# Patient Record
Sex: Female | Born: 1975
Health system: Southern US, Community
[De-identification: ages and names within clinical notes are randomized; demographics above are authoritative.]

## PROBLEM LIST (undated history)

## (undated) DIAGNOSIS — F32A Depression, unspecified: Secondary | ICD-10-CM

## (undated) DIAGNOSIS — I10 Essential (primary) hypertension: Secondary | ICD-10-CM

## (undated) DIAGNOSIS — I341 Nonrheumatic mitral (valve) prolapse: Secondary | ICD-10-CM

## (undated) DIAGNOSIS — F329 Major depressive disorder, single episode, unspecified: Secondary | ICD-10-CM

## (undated) DIAGNOSIS — D126 Benign neoplasm of colon, unspecified: Secondary | ICD-10-CM

## (undated) DIAGNOSIS — Z8619 Personal history of other infectious and parasitic diseases: Secondary | ICD-10-CM

## (undated) DIAGNOSIS — B9689 Other specified bacterial agents as the cause of diseases classified elsewhere: Secondary | ICD-10-CM

## (undated) DIAGNOSIS — N83209 Unspecified ovarian cyst, unspecified side: Secondary | ICD-10-CM

## (undated) DIAGNOSIS — F419 Anxiety disorder, unspecified: Secondary | ICD-10-CM

## (undated) DIAGNOSIS — E559 Vitamin D deficiency, unspecified: Secondary | ICD-10-CM

## (undated) DIAGNOSIS — E039 Hypothyroidism, unspecified: Secondary | ICD-10-CM

## (undated) DIAGNOSIS — N760 Acute vaginitis: Secondary | ICD-10-CM

## (undated) HISTORY — DX: Unspecified ovarian cyst, unspecified side: N83.209

## (undated) HISTORY — PX: CYSTOSCOPY: SUR368

## (undated) HISTORY — DX: Anxiety disorder, unspecified: F41.9

## (undated) HISTORY — DX: Essential (primary) hypertension: I10

## (undated) HISTORY — DX: Major depressive disorder, single episode, unspecified: F32.9

## (undated) HISTORY — PX: OTHER SURGICAL HISTORY: SHX169

## (undated) HISTORY — DX: Depression, unspecified: F32.A

## (undated) HISTORY — DX: Vitamin D deficiency, unspecified: E55.9

## (undated) HISTORY — DX: Nonrheumatic mitral (valve) prolapse: I34.1

## (undated) HISTORY — DX: Hypothyroidism, unspecified: E03.9

## (undated) HISTORY — DX: Personal history of other infectious and parasitic diseases: Z86.19

## (undated) HISTORY — DX: Benign neoplasm of colon, unspecified: D12.6

## (undated) HISTORY — DX: Other specified bacterial agents as the cause of diseases classified elsewhere: N76.0

## (undated) HISTORY — DX: Acute vaginitis: B96.89

---

## 1998-06-03 DIAGNOSIS — I341 Nonrheumatic mitral (valve) prolapse: Secondary | ICD-10-CM

## 1998-06-03 HISTORY — DX: Nonrheumatic mitral (valve) prolapse: I34.1

## 2006-12-24 ENCOUNTER — Ambulatory Visit: Payer: Self-pay | Admitting: Internal Medicine

## 2007-03-09 ENCOUNTER — Ambulatory Visit: Payer: Self-pay | Admitting: Internal Medicine

## 2008-01-18 ENCOUNTER — Encounter: Payer: Self-pay | Admitting: Maternal and Fetal Medicine

## 2008-08-01 ENCOUNTER — Inpatient Hospital Stay: Payer: Self-pay

## 2015-02-03 LAB — HM PAP SMEAR: HM Pap smear: NORMAL

## 2015-02-16 ENCOUNTER — Ambulatory Visit: Payer: Federal, State, Local not specified - PPO | Attending: Obstetrics and Gynecology | Admitting: Physical Therapy

## 2015-02-16 ENCOUNTER — Encounter: Payer: Self-pay | Admitting: Physical Therapy

## 2015-02-16 DIAGNOSIS — Z7409 Other reduced mobility: Secondary | ICD-10-CM | POA: Insufficient documentation

## 2015-02-16 DIAGNOSIS — R279 Unspecified lack of coordination: Secondary | ICD-10-CM

## 2015-02-16 DIAGNOSIS — R531 Weakness: Secondary | ICD-10-CM | POA: Diagnosis present

## 2015-02-16 NOTE — Patient Instructions (Addendum)
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 Lay on side with a 3/4 roll to simulate partial laying on belly.     Open book 15 x./ 2 day At work open gate or arm swings  Low cobra (imagine squeezing pencil under armpits, elbows towards ribs)  -->   Wall elbow press  [place] post                                                                                                      -->    Arms behind back   (avoid squeezing gluts and use ehalaxation to engage deep core muscles. And isolate bend at mid back and not in low back)                                                   Preserve the function of your pelvic floor, abdomen, and back.              Avoid decreased straining of abdominal/pelvic floor muscles with less              slouching,  holding your breath with lifting/bowel movements)                                                     FUNCTIONAL POSTURES

## 2015-02-17 NOTE — Therapy (Deleted)
[place] MAIN [facility] SERVICES [address] [place], [place], [address] Phone: [phone]   Fax:  [phone]  Patient Details  Name: Keimya Briddell MRN: [id number] Date of Birth: [birth year] Referring Provider:  Chad Cordial, PA-C  Encounter Date: 02/16/2015   Jerl Mina ,PT, DPT, E-RYT  02/17/2015, 7:26 PM  [place] MAIN [facility] SERVICES [address] [place], [place], [address] Phone: [phone]   Fax:  [phone]

## 2015-02-21 NOTE — Therapy (Signed)
[place] MAIN [facility] SERVICES [address] [place], [place], [address] Phone: [phone]   Fax:  [phone]  Physical Therapy Evaluation  Patient Details  Name: Kaitlin Strickland MRN: [id number] Date of Birth: 11-Feb-1976 Referring Provider:  Chad Cordial, PA-C  Encounter Date: 02/16/2015      PT End of Session - 02/21/15 0820    Visit Number 1   Number of Visits 12   Date for PT Re-Evaluation 05/12/15   PT Start Time 1500   PT Stop Time 1610   PT Time Calculation (min) 70 min   Activity Tolerance Patient tolerated treatment well;No increased pain   Behavior During Therapy [facility] for tasks assessed/performed      Past Medical History  Diagnosis Date  . Anxiety     Past Surgical History  Procedure Laterality Date  . Wisdom teeth removal      There were no vitals filed for this visit.  Visit Diagnosis:  Lack of coordination - Plan: PT plan of care cert/re-cert  Weakness - Plan: PT plan of care cert/re-cert  Mobility impaired - Plan: PT plan of care cert/re-cert      Subjective Assessment - 02/21/15 0834    Subjective 1) shoulder/upper back pain started 3 years ago gradually without injury. Pt described it as achy and tense through her neck which worsens as the day progresses with standing and sitting on the job. 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 Pain at its worse 7/8-10 and at its best 0/10 which occurs mostly on the weekends.  Pt states she has limitations in yoga poses because her shoulders feel sore. Pt has started more strengthening w/ boot camp classes 3x/week . (10 lb UE bilaterally, 40 push-up intermittently ), yoga 1x/week "Intro to Yoga" with 10 x chatarungas. Pt sleeps on her belly,     2) LBP: Hx of LBP since high school, and increased LBP and hip pain during pregnancy 6 years ago. Pt started running 2 years post-partum and pt states it has helped with her hip and back pain.             [facility] PT Assessment - 02/21/15 0806    Observation/Other Assessments   Observations seated w/ ankles crossed under chair, slumped  forward head   Other Surveys  --  Pain Disability Index: 34%    Coordination   Gross Motor Movements are Fluid and Coordinated --  abdominal/pelvic floor straining w/ cue for bowel movement   Fine Motor Movements are Fluid and Coordinated --  limited pelvic floor lift noted through clothing    Posture/Postural Control   Posture Comments limited diaphragmatic excursion   ROM / Strength   AROM / PROM / Strength --  limited segmental thoracic mobility in ext/flexion   Strength   Overall Strength --  hip ext 4/5 bil w/ compensation (pelvic rocking, spinal ext)   Overall Strength Comments --  hipe abd bil 4/5    Palpation   Spinal mobility significant mm thoracic mm tensions/ hypomobile segments T-spine (upper segmentsincreased hypomobility)  SP noted    SI assessment  symmetrical   Bed Mobility   Bed Mobility --  half crunch method, able to log roll                   [facility] Adult PT Treatment/Exercise - 02/21/15 0806    Self-Care   Self-Care --  work stretches to offset mm tensions  Neuro Re-ed    Neuro Re-ed Details  proper sitting posture, positions to decrease pelvic floor/abdominal straining   Manual Therapy   Joint Mobilization PA mobs Grade II-III along SP /TP of  T-segments, MWM with low cobra (to address upper T-segments)    Soft tissue mobilization kneading along paraspinals                 PT Education - 02/21/15 0819    Education provided Yes   Education Details HEP, POC, anatomy & physiology, goals,    Person(s) Educated Patient   Methods Explanation;Demonstration;Tactile cues;Verbal cues;Handout   Comprehension Verbalized understanding;Returned demonstration             PT Long Term Goals - 02/21/15 3903    PT LONG TERM GOAL #1   Title Pt will decrease her score on [place] from 34% to < 24% in order to demo  improved pain and increased function at work, home, and recreational activities.    Time 12   Period Weeks   Status New   PT LONG TERM GOAL #2   Title Pt will demo improved thoracic mobility segmental with PAVM PA mobs and decreased mm tensions in order to progress to core strengthening.   Time 12   Period Weeks   Status New   PT LONG TERM GOAL #3   Title Pt will be compliant with HEP in order to demo IND with pain management.   Time 12   Period Weeks   Status New   PT LONG TERM GOAL #4   Title Pt will demo no spinal/pelvic instability with hip ext in prone in order to demo improved core strength and minimize risk with fitness activties.    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  These deficits interfere with her ability to participate in yoga classes, standing long periods, sleeping, and put her at risk for injuries in fitness activities.    Pt will benefit from skilled therapeutic intervention in order to improve on the following deficits Decreased activity tolerance;Decreased balance;Decreased safety awareness;Decreased range of motion;Decreased endurance;Hypomobility;Decreased strength;Increased muscle spasms;Decreased coordination;Decreased mobility;Increased fascial restricitons;Improper body mechanics;Pain;Postural dysfunction;Impaired flexibility   Rehab Potential Good   PT Frequency 1x / week   PT Duration 12 weeks   PT Treatment/Interventions ADLs/Self Care Home Management;Aquatic Therapy;Biofeedback;Cryotherapy;Electrical  Stimulation;Gait training;Fluidtherapy;Moist Heat;Traction;Stair training;Functional mobility training;Therapeutic activities;Therapeutic exercise;Balance training;Neuromuscular re-education;Manual techniques;Patient/family education;Passive range of motion;Dry needling;Energy conservation;Taping   PT Next Visit Plan pelvic floor assessment, manual releases at thorax   Consulted and Agree with Plan of Care Patient         Problem List There are no active problems to display for this patient.   Jerl Mina ,PT, DPT, E-RYT  02/21/2015, 8:50 AM  [place] MAIN [facility] SERVICES [address] [place], [place], [address] Phone: [phone]   Fax:  [phone]

## 2015-02-21 NOTE — Addendum Note (Signed)
Addended by: Jerl Mina on: 02/21/2015 08:53 AM   Modules accepted: Orders

## 2015-02-23 ENCOUNTER — Ambulatory Visit: Payer: Federal, State, Local not specified - PPO | Admitting: Physical Therapy

## 2015-02-23 DIAGNOSIS — R279 Unspecified lack of coordination: Secondary | ICD-10-CM

## 2015-02-23 DIAGNOSIS — Z7409 Other reduced mobility: Secondary | ICD-10-CM

## 2015-02-23 DIAGNOSIS — R531 Weakness: Secondary | ICD-10-CM

## 2015-02-23 NOTE — Patient Instructions (Addendum)
5  breaths of these back stretches in addition to open book   3-way childs pose in kneeling and at standing by chair (to do at work)   Supine twist (move hips over to opposite side of knees)       __________________   Dynamic Stabilization Level 1-2   You are now ready to begin training the deep core muscles system: diaphragm, transverse abdominis, pelvic floor . These muscles must work together as a team.           The key to these exercises to train the brain to coordinate the timing of these muscles and to have them turn on for long periods of time to hold you upright against gravity (especially important if you are on your feet all day).These muscles are postural muscles and play a role stabilizing your spine and bodyweight. By doing these repetitions slowly and correctly instead of doing crunches, you will achieve a flatter belly without a lower pooch. You are also placing your spine in a more neutral position and breathing properly which in turn, decreases your risk for problems related to your pelvic floor, abdominal, and low back such as pelvic organ prolapse, hernias, diastasis recti (separation of superficial muscles), disk herniations, spinal fractures. These exercises set a solid foundation for you to later progress to resistance/ strength training with therabands and weights and return to other typical fitness exercises with a stronger deeper core.    --------------   Pelvic Floor Endurance 10 sec holds, 3 sec/ 3-5 x day

## 2015-02-24 NOTE — Therapy (Signed)
[place] MAIN [facility] SERVICES [address] [place], [place], [address] Phone: [phone]   Fax:  [phone]  Physical Therapy Treatment  Patient Details  Name: Kaitlin Strickland MRN: [id number] Date of Birth: September 23, 1975 Referring Provider:  Chad Cordial, PA-C  Encounter Date: 02/23/2015      PT End of Session - 02/24/15 2338    Visit Number 2   Number of Visits 12   Date for PT Re-Evaluation 05/12/15   PT Start Time 1500   PT Stop Time 1605   PT Time Calculation (min) 65 min   Activity Tolerance Patient tolerated treatment well;No increased pain   Behavior During Therapy [facility] for tasks assessed/performed      Past Medical History  Diagnosis Date  . Anxiety     Past Surgical History  Procedure Laterality Date  . Wisdom teeth removal      There were no vitals filed for this visit.  Visit Diagnosis:  Lack of coordination  Weakness  Mobility impaired      Subjective Assessment - 02/24/15 2351    Subjective Pt reported feeling sore after last sesion but the soreness went away in the mid back. Pt has been recorrecting her posture at work and also with sleeping.                           [place] Adult PT Treatment/Exercise - 02/24/15 2329    Neuro Re-ed    Neuro Re-ed Details  dynamic stabilization 1-2, dissociation of pelvic from thorax w/ rotation, lower trunk rotatin, pelvic floor endurance strengthening   Exercises   Exercises --  3 way childs pose    Manual Therapy   Joint Mobilization PA mobs Grade II-III along SP /TP of  T-segments, MWM with low cobra (to address upper T-segments)    Soft tissue mobilization kneading along paraspinals                 PT Education - 02/23/15 1558    Education provided Yes   Education Details HEP, anantomy and physiology, principles of exercises, reason for new HEP   Person(s) Educated Patient   Methods Explanation;Demonstration;Tactile cues;Handout;Verbal  cues   Comprehension Verbalized understanding;Returned demonstration             PT Long Term Goals - 02/21/15 6063    PT LONG TERM GOAL #1   Title Pt will decrease her score on [place] from 34% to < 24% in order to demo improved pain and increased function at work, home, and recreational activities.    Time 12   Period Weeks   Status New   PT LONG TERM GOAL #2   Title Pt will demo improved thoracic mobility segmental with PAVM PA mobs and decreased mm tensions in order to progress to core strengthening.   Time 12   Period Weeks   Status New   PT LONG TERM GOAL #3   Title Pt will be compliant with HEP in order to demo IND with pain management.   Time 12   Period Weeks   Status New   PT LONG TERM GOAL #4   Title Pt will demo no spinal/pelvic instability with hip ext in prone in order to demo improved core strength and minimize risk with fitness activties.    Time 12   Period Weeks   Status New               Plan -  02/24/15 2339    Clinical Impression Statement Pt tolerated manual Tx without complaints. She demo'd decreased mm tensions around posterior thorax region and improved pelvic floor coordination after training. Pt continues to benefit from skilled PT to release mm tensions in thorax, increase thoracic  mobility,  and  improving  deep core  coordination /strength.     Pt will benefit from skilled therapeutic intervention in order to improve on the following deficits Decreased activity tolerance;Decreased balance;Decreased safety awareness;Decreased range of motion;Decreased endurance;Hypomobility;Decreased strength;Increased muscle spasms;Decreased coordination;Decreased mobility;Increased fascial restricitons;Improper body mechanics;Pain;Postural dysfunction;Impaired flexibility   Rehab Potential Good   PT Frequency 1x / week   PT Duration 12 weeks   PT Treatment/Interventions ADLs/Self Care Home Management;Aquatic Therapy;Biofeedback;Cryotherapy;Electrical  Stimulation;Gait training;Fluidtherapy;Moist Heat;Traction;Stair training;Functional mobility training;Therapeutic activities;Therapeutic exercise;Balance training;Neuromuscular re-education;Manual techniques;Patient/family education;Passive range of motion;Dry needling;Energy conservation;Taping   PT Next Visit Plan pelvic floor assessment, manual releases at thorax   Consulted and Agree with Plan of Care Patient        Problem List There are no active problems to display for this patient.   Jerl Mina ,PT, DPT, E-RYT  02/24/2015, 11:51 PM  [place] MAIN [facility] SERVICES [address] [place], [place], [address] Phone: [phone]   Fax:  [phone]

## 2015-03-02 ENCOUNTER — Ambulatory Visit: Payer: Federal, State, Local not specified - PPO | Admitting: Physical Therapy

## 2015-03-02 DIAGNOSIS — R531 Weakness: Secondary | ICD-10-CM

## 2015-03-02 DIAGNOSIS — Z7409 Other reduced mobility: Secondary | ICD-10-CM

## 2015-03-02 DIAGNOSIS — R279 Unspecified lack of coordination: Secondary | ICD-10-CM | POA: Diagnosis not present

## 2015-03-02 NOTE — Therapy (Signed)
[place] MAIN [facility] SERVICES [address] [place], [place], [address] Phone: [phone]   Fax:  [phone]  Physical Therapy Treatment  Patient Details  Name: Kaitlin Strickland MRN: [id number] Date of Birth: [birth year] Referring Provider:  Chad Cordial, PA-C  Encounter Date: 03/02/2015      PT End of Session - 03/02/15 1549    Visit Number 3   Number of Visits 12   Date for PT Re-Evaluation 05/12/15   PT Start Time 1027   PT Stop Time 1540   PT Time Calculation (min) 45 min   Activity Tolerance Patient tolerated treatment well;No increased pain   Behavior During Therapy [facility] for tasks assessed/performed      Past Medical History  Diagnosis Date  . Anxiety     Past Surgical History  Procedure Laterality Date  . Wisdom teeth removal      There were no vitals filed for this visit.  Visit Diagnosis:  Lack of coordination  Weakness  Mobility impaired      Subjective Assessment - 03/02/15 1454    Subjective Pt felt good after last session and was not sore. Pt did have to sit in a car and at work for extensive hours which has caused tensions in the neck. Pt had no pain with the HEP but was not able to perform as often due to sickness.             [facility] PT Assessment - 03/02/15 1520    Palpation   Spinal mobility significant less mm thoracic mm tensions/ hypomobile segments T-spine (upper segmentsincreased hypomobility)  SP noted   thoracic spine decreased kyphosis                      [facility] Adult PT Treatment/Exercise - 03/02/15 1525    Self-Care   Other Self-Care Comments  self-mobilization over yoga block between shoulder blades to releve mm tensions    Therapeutic Activites    Other Therapeutic Activities rabbit pose, thread the needle, and supported shoulder stand by wall with a block  under sacrum to relieve upper thoracic discomfort   Neuro Re-ed    Neuro Re-ed Details  dynamic stabilization 3-4  (30 reps)  min cuing  ribcage over pelvic alignment to decrease lumbar lordosis                PT Education - 03/02/15 1548    Education provided Yes   Education Details HEP   Person(s) Educated Patient   Methods Explanation;Demonstration;Tactile cues;Verbal cues;Handout   Comprehension Verbalized understanding;Returned demonstration             PT Long Term Goals - 02/21/15 2536    PT LONG TERM GOAL #1   Title Pt will decrease her score on [place] from 34% to < 24% in order to demo improved pain and increased function at work, home, and recreational activities.    Time 12   Period Weeks   Status New   PT LONG TERM GOAL #2   Title Pt will demo improved thoracic mobility segmental with PAVM PA mobs and decreased mm tensions in order to progress to core strengthening.   Time 12   Period Weeks   Status New   PT LONG TERM GOAL #3   Title Pt will be compliant with HEP in order to demo IND with pain management.   Time 12   Period Weeks   Status New   PT LONG TERM  GOAL #4   Title Pt will demo no spinal/pelvic instability with hip ext in prone in order to demo improved core strength and minimize risk with fitness activties.    Time 12   Period Weeks   Status New               Plan - 03/02/15 1551    Clinical Impression Statement Pt demo'd significantly decreased mm tensions along her mid back  and did not require manual Tx. Pt's thoracic spine has a decreased curve which PT suspects to be another aspect  contributing to her upper back. Pt responded without complaints and reported relief after performing  three yoga poses selected  for increasing upper thoracic flexion. Pt was also shown ways to self-mobilization midback mm with yoga blocks for self-care. Pt progressed to Dynamic Stabilization Level 3-4 with little cuing.     Pt will benefit from skilled therapeutic intervention in order to improve on the following deficits Decreased activity tolerance;Decreased  balance;Decreased safety awareness;Decreased range of motion;Decreased endurance;Hypomobility;Decreased strength;Increased muscle spasms;Decreased coordination;Decreased mobility;Increased fascial restricitons;Improper body mechanics;Pain;Postural dysfunction;Impaired flexibility   Rehab Potential Good   PT Frequency 1x / week   PT Duration 12 weeks   PT Treatment/Interventions ADLs/Self Care Home Management;Aquatic Therapy;Biofeedback;Cryotherapy;Electrical Stimulation;Gait training;Fluidtherapy;Moist Heat;Traction;Stair training;Functional mobility training;Therapeutic activities;Therapeutic exercise;Balance training;Neuromuscular re-education;Manual techniques;Patient/family education;Passive range of motion;Dry needling;Energy conservation;Taping   PT Next Visit Plan pelvic floor assessment, modified pilates w/ bands   Consulted and Agree with Plan of Care Patient        Problem List There are no active problems to display for this patient.   Jerl Mina ,PT, DPT, E-RYT  03/02/2015, 3:59 PM  [place] MAIN [facility] SERVICES [address] [place], [place], [address] Phone: [phone]   Fax:  [phone]

## 2015-03-02 NOTE — Patient Instructions (Addendum)
Propioception in sitting:  Thumbs on ribcage, index finger on pelvis (stacked) to decrease lumbar lordosis and to shift belly button back and bear weight on sitting bones instead thighs   Stretches for upper back:   (no pressure on neck)  rabbit pose thread the needle supported shoulder stand by wall with a block  under sacrum  5 breath-8 breaths    Dynamic Stabilization Level 3-4  Replacing 1-2  30reps    Pelvic Floor Exercises: Continue with 10 sec , 10 reps but do not do when pelvic floor muscle are tired, Don't do 2 sets back to back.

## 2015-03-09 ENCOUNTER — Ambulatory Visit: Payer: Federal, State, Local not specified - PPO | Attending: Obstetrics and Gynecology | Admitting: Physical Therapy

## 2015-03-09 DIAGNOSIS — R279 Unspecified lack of coordination: Secondary | ICD-10-CM | POA: Insufficient documentation

## 2015-03-09 DIAGNOSIS — Z7409 Other reduced mobility: Secondary | ICD-10-CM | POA: Insufficient documentation

## 2015-03-09 DIAGNOSIS — R531 Weakness: Secondary | ICD-10-CM | POA: Diagnosis present

## 2015-03-09 NOTE — Patient Instructions (Addendum)
PELVIC FLOOR / KEGEL EXERCISES   Pelvic floor/ Kegel exercises are used to strengthen the muscles in the base of your pelvis that are responsible for supporting your pelvic organs and preventing urine/feces leakage. Based on your therapist's recommendations, they can be performed while standing, sitting, or lying down. Imagine pelvic floor area as a diamond with pelvic landmarks: top =pubic bone, bottom tip=tailbone, sides=sitting bones (ischial tuberosities).    Make yourself aware of this muscle group by using these cues while coordinating your breath:  Inhale, feel pelvic floor diamond area lower like hammock towards your feet and ribcage/belly expanding. Pause. Let the exhale naturally and feel your belly sink, abdominal muscles hugging in around you and you may notice the pelvic diamond draws upward towards your head forming a umbrella shape. Give a squeeze during the exhalation like you are stopping the flow of urine. If you are squeezing the buttock muscles, try to give 50% less effort.   Common Errors:  Breath holding: If you are holding your breath, you may be bearing down against your bladder instead of pulling it up. If you belly bulges up while you are squeezing, you are holding your breath. Be sure to breathe gently in and out while exercising. Counting out loud may help you avoid holding your breath.  Accessory muscle use: You should not see or feel other muscle movement when performing pelvic floor exercises. When done properly, no one can tell that you are performing the exercises. Keep the buttocks, belly and inner thighs relaxed.  Overdoing it: Your muscles can fatigue and stop working for you if you over-exercise. You may actually leak more or feel soreness at the lower abdomen or rectum.  YOUR HOME EXERCISE PROGRAM  LONG HOLDS: Position: on back w/ pillow under hips   Inhale and then exhale. Then squeeze the muscle and count aloud for 10 seconds. Rest with three long  breaths. (Be sure to let belly sink in with exhales and not push outward)  Perform 6 repetitions, 3 times/day  SHORT HOLDS: Position: on back, sitting   Inhale and then exhale. Then squeeze the muscle.  (Be sure to let belly sink in with exhales and not push outward)  Perform 5 repetitions, 5  Times/day                      DECREASE DOWNWARD PRESSURE ON  YOUR PELVIC FLOOR, ABDOMINAL, LOW BACK MUSCLES       PRESERVE YOUR PELVIC HEALTH LONG-TERM   ** SQUEEZE pelvic floor BEFORE YOUR SNEEZE, COUGH, LAUGH   ** EXHALE BEFORE YOU RISE AGAINST GRAVITY (lifting, sit to stand, from squat to stand)   ** LOG ROLL OUT OF BED INSTEAD OF CRUNCH/SIT-UP    ________________________   Modified pilates with red band (see handout)  Replaces dynamic 3-4  10 reps x 3 sets/ 1-2 x day

## 2015-03-10 NOTE — Therapy (Signed)
[place] MAIN [facility] SERVICES [address] [place], [place], [address] Phone: [phone]   Fax:  [phone]  Physical Therapy Treatment  Patient Details  Name: Kaitlin Strickland MRN: [id number] Date of Birth: 12-May-1976 Referring Provider:  Chad Cordial, PA-C  Encounter Date: 03/09/2015      PT End of Session - 03/10/15 1734    Visit Number 4   Number of Visits 12   Date for PT Re-Evaluation 05/12/15   PT Start Time 1500   PT Stop Time 1600   PT Time Calculation (min) 60 min   Activity Tolerance Patient tolerated treatment well;No increased pain   Behavior During Therapy [facility] for tasks assessed/performed      Past Medical History  Diagnosis Date  . Anxiety     Past Surgical History  Procedure Laterality Date  . Wisdom teeth removal      There were no vitals filed for this visit.  Visit Diagnosis:  Lack of coordination  Weakness  Mobility impaired      Subjective Assessment - 03/09/15 1506    Subjective Pt reported she has been doing her HEP and stated her back has not been as painful after work by 50% less.    Patient Stated Goals less pain in upper neck/ shoulder 2/10,  flexibility.              [facility] PT Assessment - 03/10/15 1733    Observation/Other Assessments   Observations less forward head, but required cuing for more posterior COM for rib/pelvis alignment    Palpation   Palpation comment significantly decreased  mm tensions along back                   Pelvic Floor Special Questions - 03/10/15 1733    Pelvic Floor Internal Exam pt consented verbally and had no contraindications    Exam Type Vaginal   Palpation posterior activation > anterior    Strength good squeeze, good lift, able to hold agaisnt strong resistance  with pillow under hips, faciliation of anterior mm    Strength # of reps 6   Strength # of seconds 10           [facility] Adult PT Treatment/Exercise - 03/10/15 1733    Neuro Re-ed    Neuro Re-ed Details  modified pilates 1-2 with red band   cues for scap retraction    Manual Therapy   Myofascial Release suprapubic area to faciliate more cephalad position of bladder   Internal Pelvic Floor thiele massage dorsal to ventral along 2nd layer mm  bilaterally                PT Education - 03/10/15 1734    Education provided Yes   Education Details HEP   Person(s) Educated Patient   Methods Explanation;Demonstration;Tactile cues;Verbal cues;Handout   Comprehension Verbalized understanding;Returned demonstration             PT Long Term Goals - 03/10/15 1730    PT LONG TERM GOAL #1   Title Pt will decrease her score on [place] from 34% to < 24% in order to demo improved pain and increased function at work, home, and recreational activities.    Time 12   Period Weeks   Status On-going   PT LONG TERM GOAL #2   Title Pt will demo improved thoracic mobility segmental with PAVM PA mobs and decreased mm tensions in order to progress to core strengthening.   Time  12   Period Weeks   Status Achieved   PT LONG TERM GOAL #3   Title Pt will be compliant with HEP in order to demo IND with pain management.   Time 12   Period Weeks   Status Achieved   PT LONG TERM GOAL #4   Title Pt will demo no spinal/pelvic instability with hip ext in prone in order to demo improved core strength and minimize risk with fitness activties.    Time 12   Period Weeks   Status On-going               Plan - 03/10/15 1724    Clinical Impression Statement Pt demo significantly decreased midback mm tensions and more upright posture without cuing. Initiated pelvic floor strengthening to optimize deep core strengthening. Pt showed posterior activation of pelvic floor mm due to abnormal position of bladder. After manual Tx and w /hips elevated on pillow, pt demo'd more circuferential contraction with more cephalad position of bladder. Progressed dynamic stabilization to  level 3-4 and  modified pilates with red band. Anticipate pt will progress towards her goals.    Pt will benefit from skilled therapeutic intervention in order to improve on the following deficits Decreased activity tolerance;Decreased balance;Decreased safety awareness;Decreased range of motion;Decreased endurance;Hypomobility;Decreased strength;Increased muscle spasms;Decreased coordination;Decreased mobility;Increased fascial restricitons;Improper body mechanics;Pain;Postural dysfunction;Impaired flexibility   Rehab Potential Good   PT Frequency 1x / week   PT Duration 12 weeks   PT Treatment/Interventions ADLs/Self Care Home Management;Aquatic Therapy;Biofeedback;Cryotherapy;Electrical Stimulation;Gait training;Fluidtherapy;Moist Heat;Traction;Stair training;Functional mobility training;Therapeutic activities;Therapeutic exercise;Balance training;Neuromuscular re-education;Manual techniques;Patient/family education;Passive range of motion;Dry needling;Energy conservation;Taping   PT Next Visit Plan progress pelvic floor strengthening, yoga sequence   Consulted and Agree with Plan of Care Patient        Problem List There are no active problems to display for this patient.   Jerl Mina  ,PT, DPT, E-RYT  03/10/2015, 5:34 PM  [place] MAIN [facility] SERVICES [address] [place], [place], [address] Phone: [phone]   Fax:  [phone]

## 2015-03-10 NOTE — Therapy (Signed)
[place] MAIN [facility] SERVICES [address] [place], [place], [address] Phone: [phone]   Fax:  [phone]  Physical Therapy Treatment  Patient Details  Name: Kaitlin Strickland MRN: [id number] Date of Birth: 07/18/1975 Referring Provider:  Chad Cordial, PA-C  Encounter Date: 03/09/2015    Past Medical History  Diagnosis Date  . Anxiety     Past Surgical History  Procedure Laterality Date  . Wisdom teeth removal      There were no vitals filed for this visit.  Visit Diagnosis:  Lack of coordination  Weakness  Mobility impaired      Subjective Assessment - 03/09/15 1506    Subjective Pt reported she has been doing her HEP and stated her back has not been as painful after work by 50% less.    Patient Stated Goals less pain in upper neck/ shoulder 2/10,  flexibility.                        Pelvic Floor Special Questions - 03/09/15 1539    Pelvic Floor Internal Exam pt consented verbally and had no contraindications    Exam Type Vaginal   Palpation posterior activation > anterior    Strength good squeeze, good lift, able to hold agaisnt strong resistance  with pillow under hips, faciliation of anterior mm    Strength # of reps 6   Strength # of seconds 10                        PT Long Term Goals - 02/21/15 5537    PT LONG TERM GOAL #1   Title Pt will decrease her score on [place] from 34% to < 24% in order to demo improved pain and increased function at work, home, and recreational activities.    Time 12   Period Weeks   Status New   PT LONG TERM GOAL #2   Title Pt will demo improved thoracic mobility segmental with PAVM PA mobs and decreased mm tensions in order to progress to core strengthening.   Time 12   Period Weeks   Status New   PT LONG TERM GOAL #3   Title Pt will be compliant with HEP in order to demo IND with pain management.   Time 12   Period Weeks   Status New   PT LONG  TERM GOAL #4   Title Pt will demo no spinal/pelvic instability with hip ext in prone in order to demo improved core strength and minimize risk with fitness activties.    Time 12   Period Weeks   Status New               Problem List There are no active problems to display for this patient.   Jerl Mina 03/10/2015, 12:12 AM  [place] [address] [place], [place], [address] Phone: [phone]   Fax:  [phone]

## 2015-03-10 NOTE — Therapy (Signed)
[place] MAIN [facility] SERVICES [address] [place], [place], [address] Phone: [phone]   Fax:  [phone]  Physical Therapy Treatment  Patient Details  Name: Kaitlin Strickland MRN: [id number] Date of Birth: 1975/07/06 Referring Provider:  Chad Cordial, PA-C  Encounter Date: 03/09/2015    Past Medical History  Diagnosis Date  . Anxiety     Past Surgical History  Procedure Laterality Date  . Wisdom teeth removal      There were no vitals filed for this visit.  Visit Diagnosis:  Lack of coordination  Weakness  Mobility impaired      Subjective Assessment - 03/09/15 1506    Subjective Pt reported she has been doing her HEP and stated her back has not been as painful after work by 50% less.    Patient Stated Goals less pain in upper neck/ shoulder 2/10,  flexibility.                        Pelvic Floor Special Questions - 03/09/15 1539    Pelvic Floor Internal Exam pt consented verbally and had no contraindications    Exam Type Vaginal   Palpation posterior activation > anterior    Strength good squeeze, good lift, able to hold agaisnt strong resistance  with pillow under hips, faciliation of anterior mm    Strength # of reps 6   Strength # of seconds 10                        PT Long Term Goals - 02/21/15 0932    PT LONG TERM GOAL #1   Title Pt will decrease her score on [place] from 34% to < 24% in order to demo improved pain and increased function at work, home, and recreational activities.    Time 12   Period Weeks   Status New   PT LONG TERM GOAL #2   Title Pt will demo improved thoracic mobility segmental with PAVM PA mobs and decreased mm tensions in order to progress to core strengthening.   Time 12   Period Weeks   Status New   PT LONG TERM GOAL #3   Title Pt will be compliant with HEP in order to demo IND with pain management.   Time 12   Period Weeks   Status New   PT LONG  TERM GOAL #4   Title Pt will demo no spinal/pelvic instability with hip ext in prone in order to demo improved core strength and minimize risk with fitness activties.    Time 12   Period Weeks   Status New               Problem List There are no active problems to display for this patient.   Jerl Mina 03/10/2015, 12:12 AM  [place] [address] [place], [place], [address] Phone: [phone]   Fax:  [phone]

## 2015-03-16 ENCOUNTER — Encounter: Payer: Federal, State, Local not specified - PPO | Admitting: Physical Therapy

## 2015-03-23 ENCOUNTER — Ambulatory Visit: Payer: Federal, State, Local not specified - PPO | Admitting: Physical Therapy

## 2015-03-23 DIAGNOSIS — R279 Unspecified lack of coordination: Secondary | ICD-10-CM

## 2015-03-23 DIAGNOSIS — R531 Weakness: Secondary | ICD-10-CM

## 2015-03-23 DIAGNOSIS — Z7409 Other reduced mobility: Secondary | ICD-10-CM

## 2015-03-23 NOTE — Patient Instructions (Signed)
Running routine:   L pinky toe down when landing   Walking routine:  Weight bear more into feet to decrease pelvic rotation   Stretching after 2 min:   In movement set:  Knee highs (opp)  Touch toes (opp)  Side step Grape vine Kick butt  Post -run stretch 5 breaths each side Hip abd. ER  Muscles:  Eagle pose Cross over (IT band)  Sitting on figure 4   Quads:  Reverse table,ext one leg, and lower the opp knee to floor, bring back other foot   Adductors: Heel to pubic bone (dont lock knee )  - rainbow - hi five to sky Extended side angle    Calves: Lunge withheel down,  Lunge with knee bent  Hamstrings by tree : with twist    ____________________________   Strengthening  Strong arches : Single leg heel raises over stairs  10x 2  Multifidis: Doubled red band 20-30 deg 10 x 2   Figure 8 red band (elbows by ribs --open aginst band like accordian while side stepping with band under ballmounds

## 2015-03-24 NOTE — Therapy (Signed)
[place] MAIN [facility] SERVICES [address] [place], [place], [address] Phone: [phone]   Fax:  [phone]  Physical Therapy Treatment  Patient Details  Name: Kaitlin Strickland MRN: [id number] Date of Birth: 1976-01-07 No Data Recorded  Encounter Date: 03/23/2015      PT End of Session - 03/24/15 2059    Visit Number 5   Number of Visits 12   Date for PT Re-Evaluation 05/12/15   PT Start Time 1500   PT Stop Time 1600   PT Time Calculation (min) 60 min   Activity Tolerance Patient tolerated treatment well;No increased pain   Behavior During Therapy [facility] for tasks assessed/performed      Past Medical History  Diagnosis Date  . Anxiety     Past Surgical History  Procedure Laterality Date  . Wisdom teeth removal      There were no vitals filed for this visit.  Visit Diagnosis:  Lack of coordination  Weakness  Mobility impaired      Subjective Assessment - 03/24/15 2052    Subjective Pt reported she has been doing her HEP and stated her back has not been as painful after work by 50% less.    Patient Stated Goals less pain in upper neck/ shoulder 2/10,  flexibility.                           [place] Adult PT Treatment/Exercise - 03/24/15 2053    Ambulation/Gait   Gait Comments running giat analysis:  L heel inversion on heel strike, execssive pelvic rotation with gait   level ground, up/down hills mechanics   Self-Care   Other Self-Care Comments  priniciples of stretches, counterstretches, and ways to minimize running injuries   Neuro Re-ed    Neuro Re-ed Details  cues for supination near end of heel strike on LLE,  increased weight bearing with heelstrike with walking to minimize excessive pelvic rotation   Exercises   Exercises --  see pt instructions                 PT Education - 03/24/15 2056    Education provided Yes   Education Details HEP   Person(s) Educated Patient   Methods  Explanation;Demonstration;Tactile cues;Verbal cues;Handout   Comprehension Returned demonstration;Verbalized understanding             PT Long Term Goals - 03/23/15 1602    PT LONG TERM GOAL #1   Title Pt will decrease her score on [place] from 34% to < 24% in order to demo improved pain and increased function at work, home, and recreational activities.    Time 12   Period Weeks   Status On-going   PT LONG TERM GOAL #2   Title Pt will demo improved thoracic mobility segmental with PAVM PA mobs and decreased mm tensions in order to progress to core strengthening.   Time 12   Period Weeks   Status Achieved   PT LONG TERM GOAL #3   Title Pt will be compliant with HEP in order to demo IND with pain management.   Time 12   Period Weeks   Status Achieved   PT LONG TERM GOAL #4   Title Pt will demo no spinal/pelvic instability with hip ext in prone in order to demo improved core strength and minimize risk with fitness activties.    Time 12   Period Weeks   Status Achieved   PT  LONG TERM GOAL #5   Title Pt will be IND with running self-care practices, demo proper running mechanics in order to run races .   Time 12   Period Weeks   Status New               Plan - 03/24/15 2059    Clinical Impression Statement Pt demo'd improved running mechanics and understanding of stretches and ways to minimize running injuries. Pt will return to PT in two weeks after her race for possibility of D/C. Pt was educated on the importance of being compliant with pelvic floor strengthening program in order as a preventive measure against SUI as a runner.    Pt will benefit from skilled therapeutic intervention in order to improve on the following deficits Decreased activity tolerance;Decreased balance;Decreased safety awareness;Decreased range of motion;Decreased endurance;Hypomobility;Decreased strength;Increased muscle spasms;Decreased coordination;Decreased mobility;Increased fascial  restricitons;Improper body mechanics;Pain;Postural dysfunction;Impaired flexibility   Rehab Potential Good   PT Frequency 1x / week   PT Duration 12 weeks   PT Treatment/Interventions ADLs/Self Care Home Management;Aquatic Therapy;Biofeedback;Cryotherapy;Electrical Stimulation;Gait training;Fluidtherapy;Moist Heat;Traction;Stair training;Functional mobility training;Therapeutic activities;Therapeutic exercise;Balance training;Neuromuscular re-education;Manual techniques;Patient/family education;Passive range of motion;Dry needling;Energy conservation;Taping   PT Next Visit Plan progress pelvic floor strengthening, yoga sequence   Consulted and Agree with Plan of Care Patient        Problem List There are no active problems to display for this patient.   Jerl Mina ,PT, DPT, E-RYT   03/24/2015, 9:05 PM  [place] MAIN [facility] SERVICES [address] [place], [place], [address] Phone: [phone]   Fax:  [phone]  Name: Kaitlin Strickland MRN: [id number] Date of Birth: [birth year]

## 2015-03-30 ENCOUNTER — Encounter: Payer: Federal, State, Local not specified - PPO | Admitting: Physical Therapy

## 2015-04-13 ENCOUNTER — Ambulatory Visit: Payer: Federal, State, Local not specified - PPO | Attending: Obstetrics and Gynecology | Admitting: Physical Therapy

## 2015-04-13 DIAGNOSIS — R531 Weakness: Secondary | ICD-10-CM | POA: Diagnosis present

## 2015-04-13 DIAGNOSIS — Z7409 Other reduced mobility: Secondary | ICD-10-CM | POA: Insufficient documentation

## 2015-04-13 DIAGNOSIS — R279 Unspecified lack of coordination: Secondary | ICD-10-CM | POA: Diagnosis present

## 2015-04-14 NOTE — Therapy (Signed)
[place] MAIN [facility] SERVICES [address] [place], [place], [address] Phone: [phone]   Fax:  [phone]  Physical Therapy Treatment/ Discharge Summary   Patient Details  Name: Kaitlin Strickland MRN: [id number] Date of Birth: [birth year] No Data Recorded  Encounter Date: 04/13/2015      PT End of Session - 04/14/15 1643    Visit Number 6   Number of Visits 12   Date for PT Re-Evaluation 05/12/15   PT Start Time 1500   PT Stop Time 1600   PT Time Calculation (min) 60 min   Activity Tolerance Patient tolerated treatment well;No increased pain   Behavior During Therapy [facility] for tasks assessed/performed      Past Medical History  Diagnosis Date  . Anxiety     Past Surgical History  Procedure Laterality Date  . Wisdom teeth removal      There were no vitals filed for this visit.  Visit Diagnosis:  Lack of coordination  Weakness  Mobility impaired      Subjective Assessment - 04/13/15 1506    Subjective Pt reported running at her race with the fastest time  she has ever had.  She was doing boot camp for 12 weeks during training time. Pt would like to continue with weight lifting and running. Pt is ready for d/c.    Patient Stated Goals less pain in upper neck/ shoulder 2/10,  flexibility.                        Pelvic Floor Special Questions - 04/14/15 1643    Pelvic Floor Internal Exam pt consented verbally and had no contraindications    Exam Type Vaginal   Strength good squeeze, good lift, able to hold agaisnt strong resistance  without pillow under hips   Strength # of reps 6   Strength # of seconds 10           [facility] Adult PT Treatment/Exercise - 04/14/15 1642    Therapeutic Activites    Other Therapeutic Activities use of cable weights in cross diagonal patterns 5 x (D1 flex, D2 ext), educated not using more than 5# dumbbells for bicep-abd-overhead, minimal cuing for form and technique                 PT Education - 04/14/15 1643    Education provided Yes   Education Details HEP, D/C   Person(s) Educated Patient   Methods Explanation;Demonstration;Tactile cues;Verbal cues;Handout   Comprehension Verbalized understanding;Returned demonstration             PT Long Term Goals - 04/13/15 1507    PT LONG TERM GOAL #1   Title Pt will decrease her score on [place] from 34% to < 24% in order to demo improved pain and increased function at work, home, and recreational activities. (11/10: 21%)    Time 12   Period Weeks   Status Achieved   PT LONG TERM GOAL #2   Title Pt will demo improved thoracic mobility segmental with PAVM PA mobs and decreased mm tensions in order to progress to core strengthening.   Time 12   Period Weeks   Status Achieved   PT LONG TERM GOAL #3   Title Pt will be compliant with HEP in order to demo IND with pain management.   Time 12   Period Weeks   Status Achieved   PT LONG TERM GOAL #4   Title Pt will demo  no spinal/pelvic instability with hip ext in prone in order to demo improved core strength and minimize risk with fitness activties.    Time 12   Period Weeks   Status Achieved   PT LONG TERM GOAL #5   Title Pt will be IND with running self-care practices, demo proper running mechanics in order to run races .   Time 12   Period Weeks   Status Achieved               Plan - 04/13/15 1515    Clinical Impression Statement Pt report "A Quite a Bit Better" on the GROC compared to [facility]. Pt 's Pain Disability Scale decreased from 34% to 21%.  Pt has achieved 5/5 goals with return to sitting at work and performing fitness routines without midback pain. Pt demo'd significantly improvement with decreased hypomobility of her thoracic spine, increased lumbopelvic stability, deep core strength, improved running gait, and a more circumferential contraction of her pelvic floor mm. Pt demo understanding of ways to minimize running injuries and  can maintain proper upright sitting, standing, and walking posture. Pt is ready for D/C. She was a pleasure to work with as she remained compliant and motivated.     Pt will benefit from skilled therapeutic intervention in order to improve on the following deficits Decreased activity tolerance;Decreased balance;Decreased safety awareness;Decreased range of motion;Decreased endurance;Hypomobility;Decreased strength;Increased muscle spasms;Decreased coordination;Decreased mobility;Increased fascial restrictions;Improper body mechanics;Pain;Postural dysfunction;Impaired flexibility   Rehab Potential Good   PT Frequency 1x / week   PT Duration 12 weeks   PT Treatment/Interventions ADLs/Self Care Home Management;Aquatic Therapy;Biofeedback;Cryotherapy;Electrical Stimulation;Gait training;Fluidtherapy;Moist Heat;Traction;Stair training;Functional mobility training;Therapeutic activities;Therapeutic exercise;Balance training;Neuromuscular re-education;Manual techniques;Patient/family education;Passive range of motion;Dry needling;Energy conservation;Taping   PT Next Visit Plan progress pelvic floor strengthening, yoga sequence   Consulted and Agree with Plan of Care Patient        Problem List There are no active problems to display for this patient.   Jerl Mina ,PT, DPT, E-RYT  04/14/2015, 4:52 PM  [place] MAIN [facility] SERVICES [address] [place], [place], [address] Phone: [phone]   Fax:  [phone]  Name: Kaitlin Strickland MRN: [id number] Date of Birth: 03-29-1976

## 2015-08-02 DIAGNOSIS — N62 Hypertrophy of breast: Secondary | ICD-10-CM | POA: Insufficient documentation

## 2015-08-02 HISTORY — PX: REDUCTION MAMMAPLASTY: SUR839

## 2015-09-22 DIAGNOSIS — E039 Hypothyroidism, unspecified: Secondary | ICD-10-CM | POA: Diagnosis not present

## 2015-12-13 DIAGNOSIS — Z1321 Encounter for screening for nutritional disorder: Secondary | ICD-10-CM | POA: Diagnosis not present

## 2015-12-13 DIAGNOSIS — N62 Hypertrophy of breast: Secondary | ICD-10-CM | POA: Diagnosis not present

## 2015-12-13 DIAGNOSIS — E039 Hypothyroidism, unspecified: Secondary | ICD-10-CM | POA: Diagnosis not present

## 2016-01-08 DIAGNOSIS — K080 Exfoliation of teeth due to systemic causes: Secondary | ICD-10-CM | POA: Diagnosis not present

## 2016-02-12 DIAGNOSIS — Z3041 Encounter for surveillance of contraceptive pills: Secondary | ICD-10-CM | POA: Diagnosis not present

## 2016-02-12 DIAGNOSIS — E039 Hypothyroidism, unspecified: Secondary | ICD-10-CM | POA: Diagnosis not present

## 2016-02-12 DIAGNOSIS — F411 Generalized anxiety disorder: Secondary | ICD-10-CM | POA: Diagnosis not present

## 2016-02-12 DIAGNOSIS — Z01419 Encounter for gynecological examination (general) (routine) without abnormal findings: Secondary | ICD-10-CM | POA: Diagnosis not present

## 2016-03-11 DIAGNOSIS — N63 Unspecified lump in unspecified breast: Secondary | ICD-10-CM | POA: Diagnosis not present

## 2016-03-13 DIAGNOSIS — N62 Hypertrophy of breast: Secondary | ICD-10-CM | POA: Diagnosis not present

## 2016-03-14 ENCOUNTER — Other Ambulatory Visit: Payer: Self-pay | Admitting: Obstetrics and Gynecology

## 2016-03-14 DIAGNOSIS — Z1231 Encounter for screening mammogram for malignant neoplasm of breast: Secondary | ICD-10-CM

## 2016-03-14 DIAGNOSIS — N63 Unspecified lump in unspecified breast: Secondary | ICD-10-CM

## 2016-03-18 ENCOUNTER — Inpatient Hospital Stay
Admission: RE | Admit: 2016-03-18 | Discharge: 2016-03-18 | Disposition: A | Discharge status: Home / Self Care | Payer: Self-pay | Source: Ambulatory Visit | Attending: *Deleted | Admitting: *Deleted

## 2016-03-18 ENCOUNTER — Other Ambulatory Visit: Payer: Self-pay | Admitting: *Deleted

## 2016-03-18 DIAGNOSIS — Z9289 Personal history of other medical treatment: Secondary | ICD-10-CM

## 2016-03-29 ENCOUNTER — Encounter: Payer: Self-pay | Admitting: Radiology

## 2016-03-29 ENCOUNTER — Ambulatory Visit
Admission: RE | Admit: 2016-03-29 | Discharge: 2016-03-29 | Disposition: A | Discharge status: Home / Self Care | Payer: Federal, State, Local not specified - PPO | Source: Ambulatory Visit | Attending: Obstetrics and Gynecology | Admitting: Obstetrics and Gynecology

## 2016-03-29 DIAGNOSIS — Z1231 Encounter for screening mammogram for malignant neoplasm of breast: Secondary | ICD-10-CM

## 2016-03-29 DIAGNOSIS — N63 Unspecified lump in unspecified breast: Secondary | ICD-10-CM

## 2016-03-29 DIAGNOSIS — R928 Other abnormal and inconclusive findings on diagnostic imaging of breast: Secondary | ICD-10-CM | POA: Insufficient documentation

## 2016-03-29 DIAGNOSIS — N6320 Unspecified lump in the left breast, unspecified quadrant: Secondary | ICD-10-CM | POA: Diagnosis not present

## 2016-06-12 DIAGNOSIS — N62 Hypertrophy of breast: Secondary | ICD-10-CM | POA: Diagnosis not present

## 2016-06-12 DIAGNOSIS — E039 Hypothyroidism, unspecified: Secondary | ICD-10-CM | POA: Diagnosis not present

## 2016-08-05 DIAGNOSIS — K080 Exfoliation of teeth due to systemic causes: Secondary | ICD-10-CM | POA: Diagnosis not present

## 2016-08-13 ENCOUNTER — Other Ambulatory Visit: Payer: Self-pay | Admitting: Obstetrics and Gynecology

## 2016-12-20 ENCOUNTER — Telehealth: Payer: Self-pay | Admitting: Obstetrics and Gynecology

## 2016-12-20 DIAGNOSIS — E039 Hypothyroidism, unspecified: Secondary | ICD-10-CM

## 2016-12-20 NOTE — Telephone Encounter (Signed)
Pt is schedule for lab work on 01/27/17 for labs. There is no order . Please advise so I my link the order to scheduled appointment.

## 2016-12-22 NOTE — Telephone Encounter (Signed)
Pt needs thyroid labs Q6 months.

## 2016-12-24 NOTE — Telephone Encounter (Signed)
Pt aware.

## 2016-12-27 ENCOUNTER — Other Ambulatory Visit: Payer: Federal, State, Local not specified - PPO

## 2016-12-27 DIAGNOSIS — E039 Hypothyroidism, unspecified: Secondary | ICD-10-CM

## 2016-12-28 LAB — TSH+FREE T4
Free T4: 1.23 ng/dL (ref 0.82–1.77)
TSH: 3.06 u[IU]/mL (ref 0.450–4.500)

## 2016-12-30 ENCOUNTER — Telehealth: Payer: Self-pay | Admitting: Obstetrics and Gynecology

## 2016-12-30 DIAGNOSIS — E039 Hypothyroidism, unspecified: Secondary | ICD-10-CM

## 2016-12-30 MED ORDER — LEVOTHYROXINE SODIUM 75 MCG PO TABS: 75.0000 ug | ORAL_TABLET | Freq: Every day | ORAL | 1 refills | Status: DC

## 2016-12-30 NOTE — Telephone Encounter (Signed)
Pt aware of normal thyroid labs on levo 75 mcg daily. Doing well. Rx RF. Has annual 9/18. Rechk labs in 6 months.

## 2017-02-12 ENCOUNTER — Other Ambulatory Visit: Payer: Self-pay | Admitting: Obstetrics and Gynecology

## 2017-02-13 ENCOUNTER — Ambulatory Visit (INDEPENDENT_AMBULATORY_CARE_PROVIDER_SITE_OTHER): Payer: Federal, State, Local not specified - PPO | Admitting: Obstetrics and Gynecology

## 2017-02-13 ENCOUNTER — Encounter: Payer: Self-pay | Admitting: Obstetrics and Gynecology

## 2017-02-13 VITALS — BP 118/74 | HR 70 | Ht 62.0 in | Wt 150.0 lb

## 2017-02-13 DIAGNOSIS — F419 Anxiety disorder, unspecified: Secondary | ICD-10-CM

## 2017-02-13 DIAGNOSIS — Z3041 Encounter for surveillance of contraceptive pills: Secondary | ICD-10-CM | POA: Diagnosis not present

## 2017-02-13 DIAGNOSIS — Z01419 Encounter for gynecological examination (general) (routine) without abnormal findings: Secondary | ICD-10-CM

## 2017-02-13 DIAGNOSIS — E039 Hypothyroidism, unspecified: Secondary | ICD-10-CM | POA: Diagnosis not present

## 2017-02-13 MED ORDER — SERTRALINE HCL 50 MG PO TABS: 50.0000 mg | ORAL_TABLET | Freq: Every day | ORAL | 12 refills | Status: DC

## 2017-02-13 MED ORDER — LEVONORGEST-ETH ESTRAD 91-DAY 0.15-0.03 MG PO TABS: 1.0000 | ORAL_TABLET | Freq: Every day | ORAL | 3 refills | Status: DC

## 2017-02-13 NOTE — Progress Notes (Signed)
PCP:  Patient, No Pcp Per   Chief Complaint  Patient presents with  . Gynecologic Exam     HPI:      Ms. Kaitlin Strickland is a 40 y.o. No obstetric history on file. who LMP was Patient's last menstrual period was 01/07/2017., presents today for her annual examination.  Her menses are Q3 months with OCPs, lasting 5 days.  Dysmenorrhea none. She does not have intermenstrual bleeding.  Sex activity: not sexually active.  Last Pap: February 03, 2015  Results were: no abnormalities /neg HPV DNA  Hx of STDs: none  There is a FH of breast cancer in her PGM, mat grt aunt. There is no FH of ovarian cancer. The patient does do self-breast exams.  Tobacco use: The patient denies current or previous tobacco use. Alcohol use: none No drug use.  Exercise: moderately active  She does get adequate calcium and Vitamin D in her diet.  She is on zoloft for anxiety with sx control. No side effects. She would like to continue. She also takes Levo 75 mcg daily for hypothyroidism. Labs were WNL 7/18. Pt is due for lab rechk 1/19.    Past Medical History:  Diagnosis Date  . Anxiety   . Hypothyroid   . MVP (mitral valve prolapse) 2000   resolved 2005  . Ovarian cyst     Past Surgical History:  Procedure Laterality Date  . REDUCTION MAMMAPLASTY Bilateral 08/2015  . wisdom teeth removal      Family History  Problem Relation Age of Onset  . Hypertension Mother   . Breast cancer Paternal Grandmother 26  . Diabetes type II Maternal Grandmother     Social History   Social History  . Marital status: Divorced    Spouse name: N/A  . Number of children: N/A  . Years of education: N/A   Occupational History  . Not on file.   Social History Main Topics  . Smoking status: Never Smoker  . Smokeless tobacco: Never Used  . Alcohol use No  . Drug use: No  . Sexual activity: Yes    Birth control/ protection: Pill   Other Topics Concern  . Not on file   Social History Narrative  . No  narrative on file    No outpatient prescriptions have been marked as taking for the 02/13/17 encounter (Office Visit) with Delva Derden, Deirdre Evener, PA-C.     ROS:  Review of Systems  Constitutional: Negative for fatigue, fever and unexpected weight change.  Respiratory: Negative for cough, shortness of breath and wheezing.   Cardiovascular: Negative for chest pain, palpitations and leg swelling.  Gastrointestinal: Negative for blood in stool, constipation, diarrhea, nausea and vomiting.  Endocrine: Negative for cold intolerance, heat intolerance and polyuria.  Genitourinary: Negative for dyspareunia, dysuria, flank pain, frequency, genital sores, hematuria, menstrual problem, pelvic pain, urgency, vaginal bleeding, vaginal discharge and vaginal pain.  Musculoskeletal: Negative for back pain, joint swelling and myalgias.  Skin: Negative for rash.  Neurological: Negative for dizziness, syncope, light-headedness, numbness and headaches.  Hematological: Negative for adenopathy.  Psychiatric/Behavioral: Negative for agitation, confusion, sleep disturbance and suicidal ideas. The patient is not nervous/anxious.      Objective: BP 118/74 (BP Location: Left Arm, Patient Position: Sitting, Cuff Size: Normal)   Pulse 70   Ht 5\' 2"  (1.575 m)   Wt 150 lb (68 kg)   LMP 01/07/2017   BMI 27.44 kg/m    Physical Exam  Constitutional: She is oriented to person,  place, and time. She appears well-developed and well-nourished.  Genitourinary: Vagina normal and uterus normal. There is no rash or tenderness on the right labia. There is no rash or tenderness on the left labia. No erythema or tenderness in the vagina. No vaginal discharge found. Right adnexum does not display mass and does not display tenderness. Left adnexum does not display mass and does not display tenderness. Cervix does not exhibit motion tenderness or polyp. Uterus is not enlarged or tender.  Neck: Normal range of motion. No thyromegaly  present.  Cardiovascular: Normal rate, regular rhythm and normal heart sounds.   No murmur heard. Pulmonary/Chest: Effort normal and breath sounds normal. Right breast exhibits no mass, no nipple discharge, no skin change and no tenderness. Left breast exhibits no mass, no nipple discharge, no skin change and no tenderness.  Abdominal: Soft. There is no tenderness. There is no guarding.  Musculoskeletal: Normal range of motion.  Neurological: She is alert and oriented to person, place, and time. No cranial nerve deficit.  Psychiatric: She has a normal mood and affect. Her behavior is normal.  Vitals reviewed.   Assessment/Plan: Encounter for annual routine gynecological examination  Encounter for surveillance of contraceptive pills - OCP RF.  - Plan: levonorgestrel-ethinyl estradiol (SEASONALE,INTROVALE,JOLESSA) 0.15-0.03 MG tablet  Acquired hypothyroidism - Rx RF levo. Rechk labs 1/19. Already has Rx till 1/19.  Anxiety - Doing well on zoloft. Rx RF - Plan: sertraline (ZOLOFT) 50 MG tablet  Meds ordered this encounter  Medications  . sertraline (ZOLOFT) 50 MG tablet    Sig: Take 1 tablet (50 mg total) by mouth daily.    Dispense:  30 tablet    Refill:  12  . levonorgestrel-ethinyl estradiol (SEASONALE,INTROVALE,JOLESSA) 0.15-0.03 MG tablet    Sig: Take 1 tablet by mouth daily.    Dispense:  1 Package    Refill:  3             GYN counsel adequate intake of calcium and vitamin D, diet and exercise     F/U  Return in about 1 year (around 02/13/2018).  Izayiah Tibbitts B. Ainsley Sanguinetti, PA-C 02/13/2017 4:06 PM

## 2017-02-20 DIAGNOSIS — K080 Exfoliation of teeth due to systemic causes: Secondary | ICD-10-CM | POA: Diagnosis not present

## 2017-03-11 ENCOUNTER — Other Ambulatory Visit: Payer: Self-pay | Admitting: Obstetrics and Gynecology

## 2017-03-31 ENCOUNTER — Other Ambulatory Visit: Payer: Self-pay | Admitting: Obstetrics and Gynecology

## 2017-03-31 DIAGNOSIS — Z3041 Encounter for surveillance of contraceptive pills: Secondary | ICD-10-CM

## 2017-04-05 ENCOUNTER — Other Ambulatory Visit: Payer: Self-pay | Admitting: Obstetrics and Gynecology

## 2017-04-05 DIAGNOSIS — E039 Hypothyroidism, unspecified: Secondary | ICD-10-CM

## 2017-06-13 ENCOUNTER — Other Ambulatory Visit: Payer: Federal, State, Local not specified - PPO

## 2017-06-13 DIAGNOSIS — E039 Hypothyroidism, unspecified: Secondary | ICD-10-CM | POA: Diagnosis not present

## 2017-06-14 LAB — TSH+FREE T4
Free T4: 1.21 ng/dL (ref 0.82–1.77)
TSH: 3.03 u[IU]/mL (ref 0.450–4.500)

## 2017-06-16 ENCOUNTER — Telehealth: Payer: Self-pay | Admitting: Obstetrics and Gynecology

## 2017-06-16 DIAGNOSIS — E039 Hypothyroidism, unspecified: Secondary | ICD-10-CM

## 2017-06-16 MED ORDER — LEVOTHYROXINE SODIUM 75 MCG PO TABS: 75.0000 ug | ORAL_TABLET | Freq: Every day | ORAL | 2 refills | Status: DC

## 2017-06-16 NOTE — Telephone Encounter (Signed)
Pt aware of normal thyroid labs on levo 75 mcg daily. Rx RF. REchk labs 9/19 annual.

## 2017-08-02 ENCOUNTER — Other Ambulatory Visit: Payer: Self-pay | Admitting: Obstetrics and Gynecology

## 2017-08-02 DIAGNOSIS — E039 Hypothyroidism, unspecified: Secondary | ICD-10-CM

## 2017-08-04 NOTE — Telephone Encounter (Signed)
Please advise 

## 2017-08-14 DIAGNOSIS — K080 Exfoliation of teeth due to systemic causes: Secondary | ICD-10-CM | POA: Diagnosis not present

## 2017-09-30 ENCOUNTER — Other Ambulatory Visit: Payer: Self-pay | Admitting: Obstetrics and Gynecology

## 2017-09-30 NOTE — Telephone Encounter (Signed)
Please advise. Thank you

## 2017-12-10 ENCOUNTER — Encounter: Payer: Self-pay | Admitting: Obstetrics and Gynecology

## 2018-02-19 DIAGNOSIS — K080 Exfoliation of teeth due to systemic causes: Secondary | ICD-10-CM | POA: Diagnosis not present

## 2018-03-06 ENCOUNTER — Other Ambulatory Visit: Payer: Self-pay | Admitting: Obstetrics and Gynecology

## 2018-03-06 DIAGNOSIS — Z3041 Encounter for surveillance of contraceptive pills: Secondary | ICD-10-CM

## 2018-03-17 ENCOUNTER — Encounter: Payer: Self-pay | Admitting: Internal Medicine

## 2018-03-17 ENCOUNTER — Ambulatory Visit: Payer: Federal, State, Local not specified - PPO | Admitting: Internal Medicine

## 2018-03-17 VITALS — BP 142/90 | HR 67 | Temp 98.4°F | Ht 62.0 in | Wt 155.2 lb

## 2018-03-17 DIAGNOSIS — Z0000 Encounter for general adult medical examination without abnormal findings: Secondary | ICD-10-CM

## 2018-03-17 DIAGNOSIS — R739 Hyperglycemia, unspecified: Secondary | ICD-10-CM

## 2018-03-17 DIAGNOSIS — Z1231 Encounter for screening mammogram for malignant neoplasm of breast: Secondary | ICD-10-CM | POA: Diagnosis not present

## 2018-03-17 DIAGNOSIS — Z1159 Encounter for screening for other viral diseases: Secondary | ICD-10-CM

## 2018-03-17 DIAGNOSIS — F39 Unspecified mood [affective] disorder: Secondary | ICD-10-CM | POA: Insufficient documentation

## 2018-03-17 DIAGNOSIS — Z13220 Encounter for screening for lipoid disorders: Secondary | ICD-10-CM

## 2018-03-17 DIAGNOSIS — Z0184 Encounter for antibody response examination: Secondary | ICD-10-CM

## 2018-03-17 DIAGNOSIS — Z1389 Encounter for screening for other disorder: Secondary | ICD-10-CM

## 2018-03-17 DIAGNOSIS — F419 Anxiety disorder, unspecified: Secondary | ICD-10-CM | POA: Diagnosis not present

## 2018-03-17 DIAGNOSIS — Z1329 Encounter for screening for other suspected endocrine disorder: Secondary | ICD-10-CM

## 2018-03-17 DIAGNOSIS — Z3041 Encounter for surveillance of contraceptive pills: Secondary | ICD-10-CM | POA: Diagnosis not present

## 2018-03-17 DIAGNOSIS — E559 Vitamin D deficiency, unspecified: Secondary | ICD-10-CM

## 2018-03-17 DIAGNOSIS — E039 Hypothyroidism, unspecified: Secondary | ICD-10-CM

## 2018-03-17 MED ORDER — LEVONORGEST-ETH ESTRAD 91-DAY 0.15-0.03 MG PO TABS: 1.0000 | ORAL_TABLET | Freq: Every day | ORAL | 4 refills | Status: DC

## 2018-03-17 MED ORDER — SERTRALINE HCL 50 MG PO TABS: 50.0000 mg | ORAL_TABLET | Freq: Every day | ORAL | 3 refills | Status: DC

## 2018-03-17 NOTE — Progress Notes (Signed)
Pre visit review using our clinic review tool, if applicable. No additional management support is needed unless otherwise documented below in the visit note. 

## 2018-03-17 NOTE — Progress Notes (Addendum)
Chief Complaint  Patient presents with  . Establish Care   New patient  1. Hypothyroidism on levo 75 mcg  2. H/o MVP per pt was on Dilt repeat echo x 2 5 years apart and no evidence of MVP so stopped dilt and no sx's  3. Anxiety and depression PH9 score 0 today and GAD 7 score 3 on zoloft 50 mg qd   Review of Systems  Constitutional: Negative for weight loss.  HENT: Negative for hearing loss.   Eyes: Negative for blurred vision.  Respiratory: Negative for shortness of breath.   Cardiovascular: Negative for chest pain.  Gastrointestinal: Negative for abdominal pain.  Musculoskeletal: Negative for falls.  Skin: Negative for rash.  Neurological: Negative for headaches.  Psychiatric/Behavioral: Negative for depression. The patient is not nervous/anxious.    Past Medical History:  Diagnosis Date  . Anxiety   . Depression   . History of chicken pox   . Hypothyroid    h/o abnormal elevated tsh   . MVP (mitral valve prolapse) 2000   resolved 2005  . Ovarian cyst   . Vitamin D deficiency    Past Surgical History:  Procedure Laterality Date  . REDUCTION MAMMAPLASTY Bilateral 08/2015  . wisdom teeth removal     Family History  Problem Relation Age of Onset  . Hypertension Mother   . Arthritis Mother   . Depression Mother   . Hyperlipidemia Mother   . Miscarriages / [place] Mother   . Thyroid disease Mother   . Breast cancer Paternal Grandmother 61  . Cancer Paternal Grandmother        late 61s   . Diabetes type II Maternal Grandmother   . Arthritis Maternal Grandmother   . Depression Maternal Grandmother   . Diabetes Maternal Grandmother   . Hyperlipidemia Maternal Grandmother   . Hypertension Maternal Grandmother   . Intellectual disability Maternal Grandmother   . Arthritis Father   . Hypertension Father   . Hyperlipidemia Maternal Grandfather   . Cancer Maternal Grandfather        SCC mohs   . Diabetes Paternal Grandfather   . Heart disease Paternal  Grandfather    Social History   Socioeconomic History  . Marital status: Divorced    Spouse name: Not on file  . Number of children: Not on file  . Years of education: Not on file  . Highest education level: Not on file  Occupational History  . Not on file  Social Needs  . Financial resource strain: Not on file  . Food insecurity:    Worry: Not on file    Inability: Not on file  . Transportation needs:    Medical: Not on file    Non-medical: Not on file  Tobacco Use  . Smoking status: Never Smoker  . Smokeless tobacco: Never Used  Substance and Sexual Activity  . Alcohol use: No  . Drug use: No  . Sexual activity: Yes    Birth control/protection: Pill  Lifestyle  . Physical activity:    Days per week: Not on file    Minutes per session: Not on file  . Stress: Not on file  Relationships  . Social connections:    Talks on phone: Not on file    Gets together: Not on file    Attends religious service: Not on file    Active member of club or organization: Not on file    Attends meetings of clubs or organizations: Not on file  Relationship status: Not on file  . Intimate partner violence:    Fear of current or ex partner: Not on file    Emotionally abused: Not on file    Physically abused: Not on file    Forced sexual activity: Not on file  Other Topics Concern  . 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 cholecalciferol (VITAMIN D) 1000 UNITS tablet Take 2 Units by mouth daily.  Marland Kitchen levonorgestrel-ethinyl estradiol (SEASONALE,INTROVALE,JOLESSA) 0.15-0.03 MG tablet Take 1 tablet by mouth daily. Use as directed  . levothyroxine (SYNTHROID, LEVOTHROID) 75 MCG tablet Take 1 tablet (75 mcg total) by mouth daily.  . sertraline (ZOLOFT) 50 MG tablet Take 1 tablet (50 mg total) by mouth daily.  . [DISCONTINUED]  levonorgestrel-ethinyl estradiol (SEASONALE,INTROVALE,JOLESSA) 0.15-0.03 MG tablet Take 1 tablet by mouth daily.  . [DISCONTINUED] sertraline (ZOLOFT) 50 MG tablet TAKE 1 TABLET BY MOUTH ONCE DAILY   Allergies  Allergen Reactions  . Penicillins Hives   No results found for this or any previous visit (from the past 2160 hour(s)). Objective  Body mass index is 28.39 kg/m. Wt Readings from Last 3 Encounters:  03/17/18 155 lb 3.2 oz (70.4 kg)  02/13/17 150 lb (68 kg)   Temp Readings from Last 3 Encounters:  03/17/18 98.4 F (36.9 C) (Oral)   BP Readings from Last 3 Encounters:  03/17/18 (!) 142/90  02/13/17 118/74   Pulse Readings from Last 3 Encounters:  03/17/18 67  02/13/17 70    Physical Exam  Constitutional: She is oriented to person, place, and time. Vital signs are normal. She appears well-developed and well-nourished. She is cooperative.  HENT:  Head: Normocephalic and atraumatic.  Right Ear: External ear normal.  Mouth/Throat: Oropharynx is clear and moist and mucous membranes are normal.  Eyes: Pupils are equal, round, and reactive to light. Conjunctivae are normal.  Cardiovascular: Normal rate, regular rhythm and normal heart sounds.  Pulmonary/Chest: Effort normal and breath sounds normal.  Neurological: She is alert and oriented to person, place, and time. Gait normal.  Skin: Skin is warm, dry and intact.  Psychiatric: She has a normal mood and affect. Her speech is normal and behavior is normal. Judgment and thought content normal. Cognition and memory are normal.  Nursing note and vitals reviewed.   Assessment   1. Anxiety and depression controlled  2. Hypothyroidism  3. H/o MVP resolved  4. HM   Plan  1. H/o anxiety GAD 3 score today and PHQ 9 score 0 today Cont meds  2. Cont meds levo 75 mcg  3. Monitor  4.  Flu shot had 02/19/18  Tdap due 08/2018 Check fasting labs  Referred mammogram  [facility] copy of pap had 0/2/72 [facility] neg pap  neg HPV   [facility]  Provider: Dr. Olivia Mackie McLean-Scocuzza-Internal Medicine

## 2018-03-17 NOTE — Patient Instructions (Signed)

## 2018-04-03 ENCOUNTER — Other Ambulatory Visit (INDEPENDENT_AMBULATORY_CARE_PROVIDER_SITE_OTHER): Payer: Federal, State, Local not specified - PPO

## 2018-04-03 DIAGNOSIS — Z1159 Encounter for screening for other viral diseases: Secondary | ICD-10-CM | POA: Diagnosis not present

## 2018-04-03 DIAGNOSIS — Z1389 Encounter for screening for other disorder: Secondary | ICD-10-CM

## 2018-04-03 DIAGNOSIS — Z13220 Encounter for screening for lipoid disorders: Secondary | ICD-10-CM | POA: Diagnosis not present

## 2018-04-03 DIAGNOSIS — Z0184 Encounter for antibody response examination: Secondary | ICD-10-CM | POA: Diagnosis not present

## 2018-04-03 DIAGNOSIS — E559 Vitamin D deficiency, unspecified: Secondary | ICD-10-CM

## 2018-04-03 DIAGNOSIS — Z1329 Encounter for screening for other suspected endocrine disorder: Secondary | ICD-10-CM | POA: Diagnosis not present

## 2018-04-03 DIAGNOSIS — R739 Hyperglycemia, unspecified: Secondary | ICD-10-CM | POA: Diagnosis not present

## 2018-04-03 DIAGNOSIS — Z0000 Encounter for general adult medical examination without abnormal findings: Secondary | ICD-10-CM | POA: Diagnosis not present

## 2018-04-03 LAB — TSH: TSH: 4.41 u[IU]/mL (ref 0.35–4.50)

## 2018-04-03 LAB — CBC WITH DIFFERENTIAL/PLATELET
BASOS ABS: 0 10*3/uL (ref 0.0–0.1)
Basophils Relative: 1.2 % (ref 0.0–3.0)
EOS ABS: 0.1 10*3/uL (ref 0.0–0.7)
Eosinophils Relative: 1.3 % (ref 0.0–5.0)
HEMATOCRIT: 42.8 % (ref 36.0–46.0)
Hemoglobin: 14.5 g/dL (ref 12.0–15.0)
Lymphocytes Relative: 44.9 % (ref 12.0–46.0)
Lymphs Abs: 1.8 10*3/uL (ref 0.7–4.0)
MCHC: 33.9 g/dL (ref 30.0–36.0)
MCV: 90.9 fl (ref 78.0–100.0)
MONO ABS: 0.2 10*3/uL (ref 0.1–1.0)
Monocytes Relative: 6 % (ref 3.0–12.0)
NEUTROS ABS: 1.9 10*3/uL (ref 1.4–7.7)
NEUTROS PCT: 46.6 % (ref 43.0–77.0)
PLATELETS: 238 10*3/uL (ref 150.0–400.0)
RBC: 4.71 Mil/uL (ref 3.87–5.11)
RDW: 13.6 % (ref 11.5–15.5)
WBC: 4.1 10*3/uL (ref 4.0–10.5)

## 2018-04-03 LAB — LIPID PANEL
CHOL/HDL RATIO: 3
CHOLESTEROL: 171 mg/dL (ref 0–200)
HDL: 58.7 mg/dL (ref >39.00)
LDL Cholesterol: 93 mg/dL (ref 0–99)
NonHDL: 112.33
TRIGLYCERIDES: 96 mg/dL (ref 0.0–149.0)
VLDL: 19.2 mg/dL (ref 0.0–40.0)

## 2018-04-03 LAB — COMPREHENSIVE METABOLIC PANEL
ALBUMIN: 4.2 g/dL (ref 3.5–5.2)
ALT: 21 U/L (ref 0–35)
AST: 25 U/L (ref 0–37)
Alkaline Phosphatase: 55 U/L (ref 39–117)
BILIRUBIN TOTAL: 0.7 mg/dL (ref 0.2–1.2)
BUN: 14 mg/dL (ref 6–23)
CO2: 28 meq/L (ref 19–32)
CREATININE: 0.87 mg/dL (ref 0.40–1.20)
Calcium: 9.3 mg/dL (ref 8.4–10.5)
Chloride: 103 mEq/L (ref 96–112)
GFR: 76.71 mL/min (ref >60.00)
Glucose, Bld: 83 mg/dL (ref 70–99)
Potassium: 4.3 mEq/L (ref 3.5–5.1)
Sodium: 140 mEq/L (ref 135–145)
Total Protein: 6.9 g/dL (ref 6.0–8.3)

## 2018-04-03 LAB — VITAMIN D 25 HYDROXY (VIT D DEFICIENCY, FRACTURES): VITD: 49.88 ng/mL (ref 30.00–100.00)

## 2018-04-03 LAB — T4, FREE: FREE T4: 0.73 ng/dL (ref 0.60–1.60)

## 2018-04-03 LAB — HEMOGLOBIN A1C: Hgb A1c MFr Bld: 5.3 % (ref 4.6–6.5)

## 2018-04-03 NOTE — Addendum Note (Signed)
Addended by: Arby Barrette on: 04/03/2018 08:23 AM   Modules accepted: Orders

## 2018-04-04 LAB — URINALYSIS, ROUTINE W REFLEX MICROSCOPIC
BILIRUBIN UA: NEGATIVE
GLUCOSE, UA: NEGATIVE
KETONES UA: NEGATIVE
Nitrite, UA: NEGATIVE
PH UA: 7.5 (ref 5.0–7.5)
PROTEIN UA: NEGATIVE
RBC UA: NEGATIVE
UUROB: 0.2 mg/dL (ref 0.2–1.0)

## 2018-04-04 LAB — MICROSCOPIC EXAMINATION
Bacteria, UA: NONE SEEN
CASTS: NONE SEEN /LPF

## 2018-04-06 LAB — MEASLES/MUMPS/RUBELLA IMMUNITY
MUMPS IGG: 15.9 [AU]/ml
RUBELLA: 1.64 {index}
Rubeola IgG: >300 AU/mL

## 2018-04-06 LAB — HEPATITIS B SURFACE ANTIBODY, QUANTITATIVE

## 2018-04-09 ENCOUNTER — Encounter: Payer: Self-pay | Admitting: Internal Medicine

## 2018-05-06 ENCOUNTER — Ambulatory Visit: Payer: Federal, State, Local not specified - PPO | Admitting: Internal Medicine

## 2018-05-06 ENCOUNTER — Encounter: Payer: Self-pay | Admitting: Internal Medicine

## 2018-05-06 VITALS — BP 136/86 | HR 72 | Temp 98.4°F | Ht 62.0 in | Wt 159.4 lb

## 2018-05-06 DIAGNOSIS — R05 Cough: Secondary | ICD-10-CM

## 2018-05-06 DIAGNOSIS — J069 Acute upper respiratory infection, unspecified: Secondary | ICD-10-CM

## 2018-05-06 DIAGNOSIS — R058 Other specified cough: Secondary | ICD-10-CM

## 2018-05-06 DIAGNOSIS — R0789 Other chest pain: Secondary | ICD-10-CM | POA: Diagnosis not present

## 2018-05-06 MED ORDER — ALBUTEROL SULFATE (2.5 MG/3ML) 0.083% IN NEBU
2.5000 mg | INHALATION_SOLUTION | Freq: Once | RESPIRATORY_TRACT | Status: AC
  Administered 2018-05-06: 2.5 mg via RESPIRATORY_TRACT

## 2018-05-06 MED ORDER — IPRATROPIUM BROMIDE 0.02 % IN SOLN
0.5000 mg | Freq: Once | RESPIRATORY_TRACT | Status: AC
  Administered 2018-05-06: 0.5 mg via RESPIRATORY_TRACT

## 2018-05-06 MED ORDER — AZITHROMYCIN 250 MG PO TABS: ORAL_TABLET | ORAL | 0 refills | Status: DC

## 2018-05-06 NOTE — Progress Notes (Signed)
Chief Complaint  Patient presents with  . Cough  . URI  . Sinusitis   Acute  1. C/o chest congestion x 1 week daughter sick 2 weeks ago with pneumonia cough is dry she feels tired c/o chest tightness and hurts to cough, no h/o asthma. Clear nasal drainage no sneezing. Head feels congestion but denies sinus pressure and she had sore throat right side the last 2 am but no exudate in throught, no fever. No medications tried    Review of Systems  Constitutional: Positive for malaise/fatigue. Negative for fever.  HENT: Positive for sore throat.   Eyes: Negative for blurred vision.  Respiratory: Positive for cough. Negative for sputum production and shortness of breath.   Cardiovascular: Negative for chest pain.       +chest tightness    Skin: Negative for rash.   Past Medical History:  Diagnosis Date  . Anxiety   . Depression   . History of chicken pox   . Hypothyroid    h/o abnormal elevated tsh   . MVP (mitral valve prolapse) 2000   resolved 2005  . Ovarian cyst   . Vitamin D deficiency    Past Surgical History:  Procedure Laterality Date  . REDUCTION MAMMAPLASTY Bilateral 08/2015  . wisdom teeth removal     Family History  Problem Relation Age of Onset  . Hypertension Mother   . Arthritis Mother   . Depression Mother   . Hyperlipidemia Mother   . Miscarriages / [place] Mother   . Thyroid disease Mother   . Breast cancer Paternal Grandmother 57  . Cancer Paternal Grandmother        late 72s   . Diabetes type II Maternal Grandmother   . Arthritis Maternal Grandmother   . Depression Maternal Grandmother   . Diabetes Maternal Grandmother   . Hyperlipidemia Maternal Grandmother   . Hypertension Maternal Grandmother   . Intellectual disability Maternal Grandmother   . Arthritis Father   . Hypertension Father   . Hyperlipidemia Maternal Grandfather   . Cancer Maternal Grandfather        SCC mohs   . Diabetes Paternal Grandfather   . Heart disease Paternal  Grandfather    Social History   Socioeconomic History  . Marital status: Divorced    Spouse name: Not on file  . Number of children: Not on file  . Years of education: Not on file  . Highest education level: Not on file  Occupational History  . Not on file  Social Needs  . Financial resource strain: Not on file  . Food insecurity:    Worry: Not on file    Inability: Not on file  . Transportation needs:    Medical: Not on file    Non-medical: Not on file  Tobacco Use  . Smoking status: Never Smoker  . Smokeless tobacco: Never Used  Substance and Sexual Activity  . Alcohol use: No  . Drug use: No  . Sexual activity: Yes    Birth control/protection: Pill  Lifestyle  . Physical activity:    Days per week: Not on file    Minutes per session: Not on file  . Stress: Not on file  Relationships  . Social connections:    Talks on phone: Not on file    Gets together: Not on file    Attends religious service: Not on file    Active member of club or organization: Not on file    Attends meetings of clubs or  organizations: Not on file    Relationship status: Not on file  . Intimate partner violence:    Fear of current or ex partner: Not on file    Emotionally abused: Not on file    Physically abused: Not on file    Forced sexual activity: Not on file  Other Topics Concern  . 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 cholecalciferol (VITAMIN D) 1000 UNITS tablet Take 2 Units by mouth daily.  Marland Kitchen levonorgestrel-ethinyl estradiol (SEASONALE,INTROVALE,JOLESSA) 0.15-0.03 MG tablet Take 1 tablet by mouth daily. Use as directed  . levothyroxine (SYNTHROID, LEVOTHROID) 75 MCG tablet Take 1 tablet (75 mcg total) by mouth daily.  . sertraline (ZOLOFT) 50 MG tablet Take 1 tablet (50 mg total) by mouth daily.   Allergies   Allergen Reactions  . Penicillins Hives   Recent Results (from the past 2160 hour(s))  Hemoglobin A1c     Status: None   Collection Time: 04/03/18  8:24 AM  Result Value Ref Range   Hgb A1c MFr Bld 5.3 4.6 - 6.5 %    Comment: Glycemic Control Guidelines for People with Diabetes:Non Diabetic:  <6%Goal of Therapy: <7%Additional Action Suggested:  >8%   Vitamin D (25 hydroxy)     Status: None   Collection Time: 04/03/18  8:24 AM  Result Value Ref Range   VITD 49.88 30.00 - 100.00 ng/mL  T4, free     Status: None   Collection Time: 04/03/18  8:24 AM  Result Value Ref Range   Free T4 0.73 0.60 - 1.60 ng/dL    Comment: Specimens from patients who are undergoing biotin therapy and /or ingesting biotin supplements may contain high levels of biotin.  The higher biotin concentration in these specimens interferes with this Free T4 assay.  Specimens that contain high levels  of biotin may cause false high results for this Free T4 assay.  Please interpret results in light of the total clinical presentation of the patient.    TSH     Status: None   Collection Time: 04/03/18  8:24 AM  Result Value Ref Range   TSH 4.41 0.35 - 4.50 uIU/mL  Lipid panel     Status: None   Collection Time: 04/03/18  8:24 AM  Result Value Ref Range   Cholesterol 171 0 - 200 mg/dL    Comment: ATP III Classification       Desirable:  < 200 mg/dL               Borderline High:  200 - 239 mg/dL          High:  > = 240 mg/dL   Triglycerides 96.0 0.0 - 149.0 mg/dL    Comment: Normal:  <150 mg/dLBorderline High:  150 - 199 mg/dL   HDL 58.70 >39.00 mg/dL   VLDL 19.2 0.0 - 40.0 mg/dL   LDL Cholesterol 93 0 - 99 mg/dL   Total CHOL/HDL Ratio 3     Comment:                Men          Women1/2 Average Risk     3.4          3.3Average Risk  5.0          4.42X Average Risk          9.6          7.13X Average Risk          15.0          11.0                       NonHDL 112.33     Comment: NOTE:  Non-HDL goal should be 30  mg/dL higher than patient's LDL goal (i.e. LDL goal of < 70 mg/dL, would have non-HDL goal of < 100 mg/dL)  Comprehensive metabolic panel     Status: None   Collection Time: 04/03/18  8:24 AM  Result Value Ref Range   Sodium 140 135 - 145 mEq/L   Potassium 4.3 3.5 - 5.1 mEq/L   Chloride 103 96 - 112 mEq/L   CO2 28 19 - 32 mEq/L   Glucose, Bld 83 70 - 99 mg/dL   BUN 14 6 - 23 mg/dL   Creatinine, Ser 0.87 0.40 - 1.20 mg/dL   Total Bilirubin 0.7 0.2 - 1.2 mg/dL   Alkaline Phosphatase 55 39 - 117 U/L   AST 25 0 - 37 U/L   ALT 21 0 - 35 U/L   Total Protein 6.9 6.0 - 8.3 g/dL   Albumin 4.2 3.5 - 5.2 g/dL   Calcium 9.3 8.4 - 10.5 mg/dL   GFR 76.71 >60.00 mL/min  CBC with Differential/Platelet     Status: None   Collection Time: 04/03/18  8:24 AM  Result Value Ref Range   WBC 4.1 4.0 - 10.5 K/uL   RBC 4.71 3.87 - 5.11 Mil/uL   Hemoglobin 14.5 12.0 - 15.0 g/dL   HCT 42.8 36.0 - 46.0 %   MCV 90.9 78.0 - 100.0 fl   MCHC 33.9 30.0 - 36.0 g/dL   RDW 13.6 11.5 - 15.5 %   Platelets 238.0 150.0 - 400.0 K/uL   Neutrophils Relative % 46.6 43.0 - 77.0 %   Lymphocytes Relative 44.9 12.0 - 46.0 %   Monocytes Relative 6.0 3.0 - 12.0 %   Eosinophils Relative 1.3 0.0 - 5.0 %   Basophils Relative 1.2 0.0 - 3.0 %   Neutro Abs 1.9 1.4 - 7.7 K/uL   Lymphs Abs 1.8 0.7 - 4.0 K/uL   Monocytes Absolute 0.2 0.1 - 1.0 K/uL   Eosinophils Absolute 0.1 0.0 - 0.7 K/uL   Basophils Absolute 0.0 0.0 - 0.1 K/uL  Hepatitis B surface antibody,quantitative     Status: Abnormal   Collection Time: 04/03/18  8:24 AM  Result Value Ref Range   Hepatitis B-Post <5 (L) > OR = 10 mIU/mL    Comment: . Patient does not have immunity to hepatitis B virus. . For additional information, please refer to http://education.[facility].com/faq/FAQ105 (This link is being provided for informational/ educational purposes only).   Measles/Mumps/Rubella Immunity     Status: None   Collection Time: 04/03/18  8:24 AM  Result  Value Ref Range   Rubeola IgG >300.00 AU/mL    Comment: AU/mL            Interpretation -----            -------------- <25.00           Negative 25.00-29.99      Equivocal >29.99           Positive . A positive result indicates  that the patient has antibody to measles virus. It does not differentiate  between an active or past infection. The clinical  diagnosis must be interpreted in conjunction with  clinical signs and symptoms of the patient.    Mumps IgG 15.90 AU/mL    Comment:  AU/mL           Interpretation -------         ---------------- <9.00             Negative 9.00-10.99        Equivocal >10.99            Positive A positive result indicates that the patient has  antibody to mumps virus. It does not differentiate between an  active or past infection. The clinical diagnosis must be interpreted in conjunction with clinical signs and symptoms of the patient. .    Rubella 1.64 index    Comment:     Index            Interpretation     -----            --------------       <0.90            Not consistent with Immunity     0.90-0.99        Equivocal     > or = 1.00      Consistent with Immunity  . The presence of rubella IgG antibody suggests  immunization or past or current infection with rubella virus.   Urinalysis, Routine w reflex microscopic     Status: Abnormal   Collection Time: 04/03/18  8:24 AM  Result Value Ref Range   Specific Gravity, UA      <=1.005 (A) 1.005 - 1.030   pH, UA 7.5 5.0 - 7.5   Color, UA Yellow Yellow   Appearance Ur Clear Clear   Leukocytes, UA 2+ (A) Negative   Protein, UA Negative Negative/Trace   Glucose, UA Negative Negative   Ketones, UA Negative Negative   RBC, UA Negative Negative   Bilirubin, UA Negative Negative   Urobilinogen, Ur 0.2 0.2 - 1.0 mg/dL   Nitrite, UA Negative Negative   Microscopic Examination See below:     Comment: Microscopic was indicated and was performed.  Microscopic Examination     Status: None    Collection Time: 04/03/18  8:24 AM  Result Value Ref Range   WBC, UA 0-5 0 - 5 /hpf   RBC, UA 0-2 0 - 2 /hpf   Epithelial Cells (non renal) 0-10 0 - 10 /hpf   Casts None seen None seen /lpf   Mucus, UA Present Not Estab.   Bacteria, UA None seen None seen/Few   Objective  Body mass index is 29.15 kg/m. Wt Readings from Last 3 Encounters:  05/06/18 159 lb 6.4 oz (72.3 kg)  03/17/18 155 lb 3.2 oz (70.4 kg)  02/13/17 150 lb (68 kg)   Temp Readings from Last 3 Encounters:  05/06/18 98.4 F (36.9 C) (Oral)  03/17/18 98.4 F (36.9 C) (Oral)   BP Readings from Last 3 Encounters:  05/06/18 136/86  03/17/18 (!) 142/90  02/13/17 118/74   Pulse Readings from Last 3 Encounters:  05/06/18 72  03/17/18 67  02/13/17 70    Physical Exam  Constitutional: She is oriented to person, place, and time. Vital signs are normal. She appears well-developed and well-nourished. She is cooperative.  HENT:  Head: Normocephalic and atraumatic.  Mouth/Throat: Oropharynx is  clear and moist and mucous membranes are normal.  Eyes: Pupils are equal, round, and reactive to light. Conjunctivae are normal.  Cardiovascular: Normal rate, regular rhythm and normal heart sounds.  Pulmonary/Chest: Effort normal and breath sounds normal.  Neurological: She is alert and oriented to person, place, and time. Gait normal.  Skin: Skin is warm, dry and intact.  Psychiatric: She has a normal mood and affect. Her speech is normal and behavior is normal. Judgment and thought content normal. Cognition and memory are normal.  Nursing note and vitals reviewed.   Assessment   1. Chest congestion and cough/URI  Plan   1.  Zpack, cold handout f/u if not better will do CXR  duoneb    Provider: Dr. Olivia Mackie McLean-Scocuzza-Internal Medicine

## 2018-05-06 NOTE — Patient Instructions (Signed)
Upper Respiratory Infection, Adult Most upper respiratory infections (URIs) are a viral infection of the air passages leading to the lungs. A URI affects the nose, throat, and upper air passages. The most common type of URI is nasopharyngitis and is typically referred to as "the common cold." URIs run their course and usually go away on their own. Most of the time, a URI does not require medical attention, but sometimes a bacterial infection in the upper airways can follow a viral infection. This is called a secondary infection. Sinus and middle ear infections are common types of secondary upper respiratory infections. Bacterial pneumonia can also complicate a URI. A URI can worsen asthma and chronic obstructive pulmonary disease (COPD). Sometimes, these complications can require emergency medical care and may be life threatening. What are the causes? Almost all URIs are caused by viruses. A virus is a type of germ and can spread from one person to another. What increases the risk? You may be at risk for a URI if:  You smoke.  You have chronic heart or lung disease.  You have a weakened defense (immune) system.  You are very young or very old.  You have nasal allergies or asthma.  You work in crowded or poorly ventilated areas.  You work in health care facilities or schools.  What are the signs or symptoms? Symptoms typically develop 2-3 days after you come in contact with a cold virus. Most viral URIs last 7-10 days. However, viral URIs from the influenza virus (flu virus) can last 14-18 days and are typically more severe. Symptoms may include:  Runny or stuffy (congested) nose.  Sneezing.  Cough.  Sore throat.  Headache.  Fatigue.  Fever.  Loss of appetite.  Pain in your forehead, behind your eyes, and over your cheekbones (sinus pain).  Muscle aches.  How is this diagnosed? Your health care provider may diagnose a URI by:  Physical exam.  Tests to check that your  symptoms are not due to another condition such as: ? Strep throat. ? Sinusitis. ? Pneumonia. ? Asthma.  How is this treated? A URI goes away on its own with time. It cannot be cured with medicines, but medicines may be prescribed or recommended to relieve symptoms. Medicines may help:  Reduce your fever.  Reduce your cough.  Relieve nasal congestion.  Follow these instructions at home:  Take medicines only as directed by your health care provider.  Gargle warm saltwater or take cough drops to comfort your throat as directed by your health care provider.  Use a warm mist humidifier or inhale steam from a shower to increase air moisture. This may make it easier to breathe.  Drink enough fluid to keep your urine clear or pale yellow.  Eat soups and other clear broths and maintain good nutrition.  Rest as needed.  Return to work when your temperature has returned to normal or as your health care provider advises. You may need to stay home longer to avoid infecting others. You can also use a face mask and careful hand washing to prevent spread of the virus.  Increase the usage of your inhaler if you have asthma.  Do not use any tobacco products, including cigarettes, chewing tobacco, or electronic cigarettes. If you need help quitting, ask your health care provider. How is this prevented? The best way to protect yourself from getting a cold is to practice good hygiene.  Avoid oral or hand contact with people with cold symptoms.  Wash your   hands often if contact occurs.  There is no clear evidence that vitamin C, vitamin E, echinacea, or exercise reduces the chance of developing a cold. However, it is always recommended to get plenty of rest, exercise, and practice good nutrition. Contact a health care provider if:  You are getting worse rather than better.  Your symptoms are not controlled by medicine.  You have chills.  You have worsening shortness of breath.  You have  brown or red mucus.  You have yellow or brown nasal discharge.  You have pain in your face, especially when you bend forward.  You have a fever.  You have swollen neck glands.  You have pain while swallowing.  You have white areas in the back of your throat. Get help right away if:  You have severe or persistent: ? Headache. ? Ear pain. ? Sinus pain. ? Chest pain.  You have chronic lung disease and any of the following: ? Wheezing. ? Prolonged cough. ? Coughing up blood. ? A change in your usual mucus.  You have a stiff neck.  You have changes in your: ? Vision. ? Hearing. ? Thinking. ? Mood. This information is not intended to replace advice given to you by your health care provider. Make sure you discuss any questions you have with your health care provider. Document Released: 11/13/2000 Document Revised: 01/21/2016 Document Reviewed: 08/25/2013 Elsevier Interactive Patient Education  2018 [employer]  Cough, Adult Coughing is a reflex that clears your throat and your airways. Coughing helps to heal and protect your lungs. It is normal to cough occasionally, but a cough that happens with other symptoms or lasts a long time may be a sign of a condition that needs treatment. A cough may last only 2-3 weeks (acute), or it may last longer than 8 weeks (chronic). What are the causes? Coughing is commonly caused by:  Breathing in substances that irritate your lungs.  A viral or bacterial respiratory infection.  Allergies.  Asthma.  Postnasal drip.  Smoking.  Acid backing up from the stomach into the esophagus (gastroesophageal reflux).  Certain medicines.  Chronic lung problems, including COPD (or rarely, lung cancer).  Other medical conditions such as heart failure.  Follow these instructions at home: Pay attention to any changes in your symptoms. Take these actions to help with your discomfort:  Take medicines only as told by your health care  provider. ? If you were prescribed an antibiotic medicine, take it as told by your health care provider. Do not stop taking the antibiotic even if you start to feel better. ? Talk with your health care provider before you take a cough suppressant medicine.  Drink enough fluid to keep your urine clear or pale yellow.  If the air is dry, use a cold steam vaporizer or humidifier in your bedroom or your home to help loosen secretions.  Avoid anything that causes you to cough at work or at home.  If your cough is worse at night, try sleeping in a semi-upright position.  Avoid cigarette smoke. If you smoke, quit smoking. If you need help quitting, ask your health care provider.  Avoid caffeine.  Avoid alcohol.  Rest as needed.  Contact a health care provider if:  You have new symptoms.  You cough up pus.  Your cough does not get better after 2-3 weeks, or your cough gets worse.  You cannot control your cough with suppressant medicines and you are losing sleep.  You develop pain that is   getting worse or pain that is not controlled with pain medicines.  You have a fever.  You have unexplained weight loss.  You have night sweats. Get help right away if:  You cough up blood.  You have difficulty breathing.  Your heartbeat is very fast. This information is not intended to replace advice given to you by your health care provider. Make sure you discuss any questions you have with your health care provider. Document Released: 11/16/2010 Document Revised: 10/26/2015 Document Reviewed: 07/27/2014 Elsevier Interactive Patient Education  2018 Else[employer] Inc.  

## 2018-05-06 NOTE — Progress Notes (Signed)
Pre visit review using our clinic review tool, if applicable. No additional management support is needed unless otherwise documented below in the visit note. 

## 2018-05-15 ENCOUNTER — Ambulatory Visit
Admission: RE | Admit: 2018-05-15 | Discharge: 2018-05-15 | Disposition: A | Discharge status: Home / Self Care | Payer: Federal, State, Local not specified - PPO | Source: Ambulatory Visit | Attending: Internal Medicine | Admitting: Internal Medicine

## 2018-05-15 DIAGNOSIS — Z1231 Encounter for screening mammogram for malignant neoplasm of breast: Secondary | ICD-10-CM | POA: Insufficient documentation

## 2018-05-17 NOTE — Progress Notes (Signed)
PCP:  McLean-Scocuzza, Nino Glow, MD   Chief Complaint  Patient presents with  . Gynecologic Exam     HPI:      Ms. Kaitlin Strickland is a 42 y.o. No obstetric history on file. who LMP was Patient's last menstrual period was 03/17/2018 (approximate)., presents today for her annual examination.  Her menses are Q3 months with OCPs, lasting 5 days.  Dysmenorrhea none. She does not have intermenstrual bleeding.  Sex activity: not sexually active.  Last Pap: February 03, 2015  Results were: no abnormalities /neg HPV DNA  Hx of STDs: none  Mammo: 05/15/18 with PCP. Results: normal. Repeat in 1 yr. There is a FH of breast cancer in her PGM, mat grt aunt, genetic testing not indicated. There is no FH of ovarian cancer. The patient does do self-breast exams.  Tobacco use: The patient denies current or previous tobacco use. Alcohol use: none No drug use.  Exercise: moderately active  She does get adequate calcium and Vitamin D in her diet.  She is on zoloft for anxiety with sx control. No side effects. She would like to continue. She also takes Levo 75 mcg daily for hypothyroidism. Labs were WNL  with PCP 11/19. Both meds Rxd by PCP now.   Past Medical History:  Diagnosis Date  . Anxiety   . Depression   . History of chicken pox   . Hypothyroid    h/o abnormal elevated tsh   . MVP (mitral valve prolapse) 2000   resolved 2005  . Ovarian cyst   . Vitamin D deficiency     Past Surgical History:  Procedure Laterality Date  . REDUCTION MAMMAPLASTY Bilateral 08/2015  . wisdom teeth removal      Family History  Problem Relation Age of Onset  . Hypertension Mother   . Arthritis Mother   . Depression Mother   . Hyperlipidemia Mother   . Miscarriages / [place] Mother   . Thyroid disease Mother   . Breast cancer Paternal Grandmother 11  . Cancer Paternal Grandmother        late 9s   . Diabetes type II Maternal Grandmother   . Arthritis Maternal Grandmother   . Depression  Maternal Grandmother   . Diabetes Maternal Grandmother   . Hyperlipidemia Maternal Grandmother   . Hypertension Maternal Grandmother   . Intellectual disability Maternal Grandmother   . Arthritis Father   . Hypertension Father   . Hyperlipidemia Maternal Grandfather   . Cancer Maternal Grandfather        SCC mohs   . Diabetes Paternal Grandfather   . Heart disease Paternal Grandfather     Social History   Socioeconomic History  . Marital status: Divorced    Spouse name: Not on file  . Number of children: Not on file  . Years of education: Not on file  . Highest education level: Not on file  Occupational History  . Not on file  Social Needs  . Financial resource strain: Not on file  . Food insecurity:    Worry: Not on file    Inability: Not on file  . Transportation needs:    Medical: Not on file    Non-medical: Not on file  Tobacco Use  . Smoking status: Never Smoker  . Smokeless tobacco: Never Used  Substance and Sexual Activity  . Alcohol use: No  . Drug use: No  . Sexual activity: Not Currently    Birth control/protection: Pill  Lifestyle  . Physical activity:  Days per week: Not on file    Minutes per session: Not on file  . Stress: Not on file  Relationships  . Social connections:    Talks on phone: Not on file    Gets together: Not on file    Attends religious service: Not on file    Active member of club or organization: Not on file    Attends meetings of clubs or organizations: Not on file    Relationship status: Not on file  . Intimate partner violence:    Fear of current or ex partner: Not on file    Emotionally abused: Not on file    Physically abused: Not on file    Forced sexual activity: Not on file  Other Topics Concern  . Not on file  Social History Narrative   phd pharmacist works [employer]    1 daughter    Divorced    No guns, wears seat belt    No etoh, smoking    From [place]     Current Meds  Medication Sig  .  cholecalciferol (VITAMIN D) 1000 UNITS tablet Take 2 Units by mouth daily.  Marland Kitchen levonorgestrel-ethinyl estradiol (SEASONALE,INTROVALE,JOLESSA) 0.15-0.03 MG tablet Take 1 tablet by mouth daily. Use as directed  . levothyroxine (SYNTHROID, LEVOTHROID) 75 MCG tablet Take 1 tablet (75 mcg total) by mouth daily.  . sertraline (ZOLOFT) 50 MG tablet Take 1 tablet (50 mg total) by mouth daily.  . [DISCONTINUED] levonorgestrel-ethinyl estradiol (SEASONALE,INTROVALE,JOLESSA) 0.15-0.03 MG tablet Take 1 tablet by mouth daily. Use as directed     ROS:  Review of Systems  Constitutional: Negative for fatigue, fever and unexpected weight change.  Respiratory: Negative for cough, shortness of breath and wheezing.   Cardiovascular: Negative for chest pain, palpitations and leg swelling.  Gastrointestinal: Negative for blood in stool, constipation, diarrhea, nausea and vomiting.  Endocrine: Negative for cold intolerance, heat intolerance and polyuria.  Genitourinary: Negative for dyspareunia, dysuria, flank pain, frequency, genital sores, hematuria, menstrual problem, pelvic pain, urgency, vaginal bleeding, vaginal discharge and vaginal pain.  Musculoskeletal: Negative for back pain, joint swelling and myalgias.  Skin: Negative for rash.  Neurological: Negative for dizziness, syncope, light-headedness, numbness and headaches.  Hematological: Negative for adenopathy.  Psychiatric/Behavioral: Negative for agitation, confusion, sleep disturbance and suicidal ideas. The patient is not nervous/anxious.      Objective: BP 108/80   Pulse 71   Ht 5\' 2"  (1.575 m)   Wt 165 lb (74.8 kg)   LMP 03/17/2018 (Approximate)   BMI 30.18 kg/m    Physical Exam Constitutional:      Appearance: She is well-developed.  Genitourinary:     Vagina and uterus normal.     No vaginal discharge, erythema or tenderness.     No cervical motion tenderness or polyp.     Uterus is not enlarged or tender.     No right or left  adnexal mass present.     Right adnexa not tender.     Left adnexa not tender.  Neck:     Musculoskeletal: Normal range of motion.     Thyroid: No thyromegaly.  Cardiovascular:     Rate and Rhythm: Normal rate and regular rhythm.     Heart sounds: Normal heart sounds. No murmur.  Pulmonary:     Effort: Pulmonary effort is normal.     Breath sounds: Normal breath sounds.  Chest:     Breasts:        Right: No mass, nipple  discharge, skin change or tenderness.        Left: No mass, nipple discharge, skin change or tenderness.  Abdominal:     Palpations: Abdomen is soft.     Tenderness: There is no abdominal tenderness. There is no guarding.  Musculoskeletal: Normal range of motion.  Neurological:     Mental Status: She is alert and oriented to person, place, and time.     Cranial Nerves: No cranial nerve deficit.  Psychiatric:        Behavior: Behavior normal.  Vitals signs reviewed.     Assessment/Plan: Encounter for annual routine gynecological examination  Cervical cancer screening - Plan: Cytology - PAP  Screening for HPV (human papillomavirus) - Plan: Cytology - PAP  Encounter for surveillance of contraceptive pills - OCP RF - Plan: levonorgestrel-ethinyl estradiol (SEASONALE,INTROVALE,JOLESSA) 0.15-0.03 MG tablet  Meds ordered this encounter  Medications  . levonorgestrel-ethinyl estradiol (SEASONALE,INTROVALE,JOLESSA) 0.15-0.03 MG tablet    Sig: Take 1 tablet by mouth daily. Use as directed    Dispense:  1 Package    Refill:  3    Order Specific Question:   Supervising Provider    Answer:   Gae Dry [[id number]]             GYN counsel adequate intake of calcium and vitamin D, diet and exercise     F/U  Return in about 1 year (around 05/19/2019).  Alicia B. Copland, PA-C 05/18/2018 3:02 PM

## 2018-05-18 ENCOUNTER — Encounter: Payer: Self-pay | Admitting: Obstetrics and Gynecology

## 2018-05-18 ENCOUNTER — Ambulatory Visit (INDEPENDENT_AMBULATORY_CARE_PROVIDER_SITE_OTHER): Payer: Federal, State, Local not specified - PPO | Admitting: Obstetrics and Gynecology

## 2018-05-18 ENCOUNTER — Other Ambulatory Visit (HOSPITAL_COMMUNITY)
Admission: RE | Admit: 2018-05-18 | Discharge: 2018-05-18 | Disposition: A | Discharge status: Home / Self Care | Payer: Federal, State, Local not specified - PPO | Source: Ambulatory Visit | Attending: Obstetrics and Gynecology | Admitting: Obstetrics and Gynecology

## 2018-05-18 VITALS — BP 108/80 | HR 71 | Ht 62.0 in | Wt 165.0 lb

## 2018-05-18 DIAGNOSIS — Z01419 Encounter for gynecological examination (general) (routine) without abnormal findings: Secondary | ICD-10-CM

## 2018-05-18 DIAGNOSIS — Z124 Encounter for screening for malignant neoplasm of cervix: Secondary | ICD-10-CM

## 2018-05-18 DIAGNOSIS — Z1151 Encounter for screening for human papillomavirus (HPV): Secondary | ICD-10-CM

## 2018-05-18 DIAGNOSIS — Z3041 Encounter for surveillance of contraceptive pills: Secondary | ICD-10-CM

## 2018-05-18 MED ORDER — LEVONORGEST-ETH ESTRAD 91-DAY 0.15-0.03 MG PO TABS: 1.0000 | ORAL_TABLET | Freq: Every day | ORAL | 3 refills | Status: DC

## 2018-05-18 NOTE — Patient Instructions (Signed)
I value your feedback and entrusting us with your care. If you get a [facility] patient survey, I would appreciate you taking the time to let us know about your experience today. Thank you! 

## 2018-05-20 LAB — CYTOLOGY - PAP
DIAGNOSIS: NEGATIVE
HPV (WINDOPATH): NOT DETECTED

## 2018-07-31 ENCOUNTER — Ambulatory Visit: Payer: Federal, State, Local not specified - PPO | Admitting: Internal Medicine

## 2018-07-31 ENCOUNTER — Other Ambulatory Visit: Payer: Self-pay

## 2018-07-31 ENCOUNTER — Encounter: Payer: Self-pay | Admitting: Internal Medicine

## 2018-07-31 VITALS — BP 138/80 | HR 68 | Temp 98.2°F | Ht 62.0 in | Wt 161.8 lb

## 2018-07-31 DIAGNOSIS — Z13220 Encounter for screening for lipoid disorders: Secondary | ICD-10-CM

## 2018-07-31 DIAGNOSIS — Z23 Encounter for immunization: Secondary | ICD-10-CM

## 2018-07-31 DIAGNOSIS — Z0000 Encounter for general adult medical examination without abnormal findings: Secondary | ICD-10-CM

## 2018-07-31 DIAGNOSIS — F419 Anxiety disorder, unspecified: Secondary | ICD-10-CM

## 2018-07-31 DIAGNOSIS — E039 Hypothyroidism, unspecified: Secondary | ICD-10-CM

## 2018-07-31 DIAGNOSIS — F329 Major depressive disorder, single episode, unspecified: Secondary | ICD-10-CM

## 2018-07-31 DIAGNOSIS — Z0184 Encounter for antibody response examination: Secondary | ICD-10-CM

## 2018-07-31 DIAGNOSIS — Z1389 Encounter for screening for other disorder: Secondary | ICD-10-CM

## 2018-07-31 DIAGNOSIS — Z1159 Encounter for screening for other viral diseases: Secondary | ICD-10-CM

## 2018-07-31 DIAGNOSIS — F32A Depression, unspecified: Secondary | ICD-10-CM

## 2018-07-31 DIAGNOSIS — Z1231 Encounter for screening mammogram for malignant neoplasm of breast: Secondary | ICD-10-CM | POA: Diagnosis not present

## 2018-07-31 DIAGNOSIS — Z1329 Encounter for screening for other suspected endocrine disorder: Secondary | ICD-10-CM

## 2018-07-31 HISTORY — DX: Encounter for general adult medical examination without abnormal findings: Z00.00

## 2018-07-31 MED ORDER — LEVOTHYROXINE SODIUM 75 MCG PO TABS: 75.0000 ug | ORAL_TABLET | Freq: Every day | ORAL | 3 refills | Status: DC

## 2018-07-31 NOTE — Patient Instructions (Signed)
Hepatitis B Vaccine, Recombinant injection What is this medicine? HEPATITIS B VACCINE (hep uh TAHY tis B VAK seen) is a vaccine. It is used to prevent an infection with the hepatitis B virus. This medicine may be used for other purposes; ask your health care provider or pharmacist if you have questions. COMMON BRAND NAME(S): Engerix-B, Recombivax HB What should I tell my health care provider before I take this medicine? They need to know if you have any of these conditions: -fever, infection -heart disease -hepatitis B infection -immune system problems -kidney disease -an unusual or allergic reaction to vaccines, yeast, other medicines, foods, dyes, or preservatives -pregnant or trying to get pregnant -breast-feeding How should I use this medicine? This vaccine is for injection into a muscle. It is given by a health care professional. A copy of Vaccine Information Statements will be given before each vaccination. Read this sheet carefully each time. The sheet may change frequently. Talk to your pediatrician regarding the use of this medicine in children. While this drug may be prescribed for children as young as newborn for selected conditions, precautions do apply. Overdosage: If you think you have taken too much of this medicine contact a poison control center or emergency room at once. NOTE: This medicine is only for you. Do not share this medicine with others. What if I miss a dose? It is important not to miss your dose. Call your doctor or health care professional if you are unable to keep an appointment. What may interact with this medicine? -medicines that suppress your immune function like adalimumab, anakinra, infliximab -medicines to treat cancer -steroid medicines like prednisone or cortisone This list may not describe all possible interactions. Give your health care provider a list of all the medicines, herbs, non-prescription drugs, or dietary supplements you use. Also tell  them if you smoke, drink alcohol, or use illegal drugs. Some items may interact with your medicine. What should I watch for while using this medicine? See your health care provider for all shots of this vaccine as directed. You must have 3 shots of this vaccine for protection from hepatitis B infection. Tell your doctor right away if you have any serious or unusual side effects after getting this vaccine. What side effects may I notice from receiving this medicine? Side effects that you should report to your doctor or health care professional as soon as possible: -allergic reactions like skin rash, itching or hives, swelling of the face, lips, or tongue -breathing problems -confused, irritated -fast, irregular heartbeat -flu-like syndrome -numb, tingling pain -seizures -unusually weak or tired Side effects that usually do not require medical attention (report to your doctor or health care professional if they continue or are bothersome): -diarrhea -fever -headache -loss of appetite -muscle pain -nausea -pain, redness, swelling, or irritation at site where injected -tiredness This list may not describe all possible side effects. Call your doctor for medical advice about side effects. You may report side effects to FDA at 1-800-FDA-1088. Where should I keep my medicine? This drug is given in a hospital or clinic and will not be stored at home. NOTE: This sheet is a summary. It may not cover all possible information. If you have questions about this medicine, talk to your doctor, pharmacist, or health care provider.  2019 Elsevier/Gold Standard (2013-09-20 13:26:01)  Tdap Vaccine (Tetanus, Diphtheria and Pertussis): What You Need to Know 1. Why get vaccinated? Tetanus, diphtheria and pertussis are very serious diseases. Tdap vaccine can protect [place] from these diseases.  And, Tdap vaccine given to pregnant women can protect newborn babies against pertussis.Marland Kitchen TETANUS (Lockjaw) is rare in the  [place] today. It causes painful muscle tightening and stiffness, usually all over the body.  It can lead to tightening of muscles in the head and neck so you can't open your mouth, swallow, or sometimes even breathe. Tetanus kills about 1 out of 10 people who are infected even after receiving the best medical care. DIPHTHERIA is also rare in the [place] today. It can cause a thick coating to form in the back of the throat.  It can lead to breathing problems, heart failure, paralysis, and death. PERTUSSIS (Whooping Cough) causes severe coughing spells, which can cause difficulty breathing, vomiting and disturbed sleep.  It can also lead to weight loss, incontinence, and rib fractures. Up to 2 in 100 adolescents and 5 in 100 adults with pertussis are hospitalized or have complications, which could include pneumonia or death. These diseases are caused by bacteria. Diphtheria and pertussis are spread from person to person through secretions from coughing or sneezing. Tetanus enters the body through cuts, scratches, or wounds. Before vaccines, as many as 200,000 cases of diphtheria, 200,000 cases of pertussis, and hundreds of cases of tetanus, were reported in the [place] each year. Since vaccination began, reports of cases for tetanus and diphtheria have dropped by about 99% and for pertussis by about 80%. 2. Tdap vaccine Tdap vaccine can protect adolescents and adults from tetanus, diphtheria, and pertussis. One dose of Tdap is routinely given at age 1 or 48. People who did not get Tdap at that age should get it as soon as possible. Tdap is especially important for healthcare professionals and anyone having close contact with a baby younger than 12 months. Pregnant women should get a dose of Tdap during every pregnancy, to protect the newborn from pertussis. Infants are most at risk for severe, life-threatening complications from pertussis. Another vaccine, called Td, protects against  tetanus and diphtheria, but not pertussis. A Td booster should be given every 10 years. Tdap may be given as one of these boosters if you have never gotten Tdap before. Tdap may also be given after a severe cut or burn to prevent tetanus infection. Your doctor or the person giving you the vaccine can give you more information. Tdap may safely be given at the same time as other vaccines. 3. Some people should not get this vaccine  A person who has ever had a life-threatening allergic reaction after a previous dose of any diphtheria, tetanus or pertussis containing vaccine, OR has a severe allergy to any part of this vaccine, should not get Tdap vaccine. Tell the person giving the vaccine about any severe allergies.  Anyone who had coma or long repeated seizures within 7 days after a childhood dose of DTP or DTaP, or a previous dose of Tdap, should not get Tdap, unless a cause other than the vaccine was found. They can still get Td.  Talk to your doctor if you: ? have seizures or another nervous system problem, ? had severe pain or swelling after any vaccine containing diphtheria, tetanus or pertussis, ? ever had a condition called Guillain-Barr Syndrome (GBS), ? aren't feeling well on the day the shot is scheduled. 4. Risks With any medicine, including vaccines, there is a chance of side effects. These are usually mild and go away on their own. Serious reactions are also possible but are rare. Most people who get Tdap vaccine do  not have any problems with it. Mild problems following Tdap (Did not interfere with activities)  Pain where the shot was given (about 3 in 4 adolescents or 2 in 3 adults)  Redness or swelling where the shot was given (about 1 person in 5)  Mild fever of at least 100.70F (up to about 1 in 25 adolescents or 1 in 100 adults)  Headache (about 3 or 4 people in 10)  Tiredness (about 1 person in 3 or 4)  Nausea, vomiting, diarrhea, stomach ache (up to 1 in 4  adolescents or 1 in 10 adults)  Chills, sore joints (about 1 person in 10)  Body aches (about 1 person in 3 or 4)  Rash, swollen glands (uncommon) Moderate problems following Tdap (Interfered with activities, but did not require medical attention)  Pain where the shot was given (up to 1 in 5 or 6)  Redness or swelling where the shot was given (up to about 1 in 16 adolescents or 1 in 12 adults)  Fever over 102F (about 1 in 100 adolescents or 1 in 250 adults)  Headache (about 1 in 7 adolescents or 1 in 10 adults)  Nausea, vomiting, diarrhea, stomach ache (up to 1 or 3 people in 100)  Swelling of the entire arm where the shot was given (up to about 1 in 500). Severe problems following Tdap (Unable to perform usual activities; required medical attention)  Swelling, severe pain, bleeding and redness in the arm where the shot was given (rare). Problems that could happen after any vaccine:  People sometimes faint after a medical procedure, including vaccination. Sitting or lying down for about 15 minutes can help prevent fainting, and injuries caused by a fall. Tell your doctor if you feel dizzy, or have vision changes or ringing in the ears.  Some people get severe pain in the shoulder and have difficulty moving the arm where a shot was given. This happens very rarely.  Any medication can cause a severe allergic reaction. Such reactions from a vaccine are very rare, estimated at fewer than 1 in a million doses, and would happen within a few minutes to a few hours after the vaccination. As with any medicine, there is a very remote chance of a vaccine causing a serious injury or death. The safety of vaccines is always being monitored. For more information, visit: http://www.aguilar.org/ 5. What if there is a serious problem? What should I look for?  Look for anything that concerns you, such as signs of a severe allergic reaction, very high fever, or unusual behavior. Signs of a  severe allergic reaction can include hives, swelling of the face and throat, difficulty breathing, a fast heartbeat, dizziness, and weakness. These would usually start a few minutes to a few hours after the vaccination. What should I do?  If you think it is a severe allergic reaction or other emergency that can't wait, call 9-1-1 or get the person to the nearest hospital. Otherwise, call your doctor.  Afterward, the reaction should be reported to the Vaccine Adverse Event Reporting System (VAERS). Your doctor might file this report, or you can do it yourself through the VAERS web site at www.vaers.SamedayNews.es, or by calling 640-575-0462. VAERS does not give medical advice. 6. The National Vaccine Injury Compensation Program The [employer] Vaccine Injury Compensation Program (VICP) is a federal program that was created to compensate people who may have been injured by certain vaccines. Persons who believe they may have been injured by a vaccine can learn  about the program and about filing a claim by calling 714-802-8304 or visiting the [place] website at GoldCloset.com.ee. There is a time limit to file a claim for compensation. 7. How can I learn more?  Ask your doctor. He or she can give you the vaccine package insert or suggest other sources of information.  Call your local or state health department.  Contact the Centers for Disease Control and Prevention (CDC): ? Call 931 333 0163 (1-800-CDC-INFO) or ? Visit CDC's website at http://hunter.com/ Vaccine Information Statement Tdap Vaccine (07/27/2013) This information is not intended to replace advice given to you by your health care provider. Make sure you discuss any questions you have with your health care provider. Document Released: 11/19/2011 Document Revised: 01/05/2018 Document Reviewed: 01/05/2018 Elsevier Interactive Patient Education  2019 [employer].

## 2018-07-31 NOTE — Addendum Note (Signed)
Addended by: Nanci Pina on: 07/31/2018 09:21 AM   Modules accepted: Orders

## 2018-07-31 NOTE — Progress Notes (Signed)
Chief Complaint  Patient presents with  . Follow-up   Annual  1. Wants new hep B series  2. HR 58 repeat 68 HR lower since working out and w/o sx's  3. Anxiety/mood doing well on zoloft  4. Hypothyroidism TSH normal 04/2018 on levo 75 mcg qd    Review of Systems  Constitutional: Negative for weight loss.  HENT: Negative for hearing loss.   Eyes: Negative for blurred vision.  Respiratory: Negative for shortness of breath.   Cardiovascular: Negative for chest pain.  Gastrointestinal: Negative for abdominal pain.  Musculoskeletal: Negative for falls.  Skin: Negative for rash.  Neurological: Negative for headaches.  Psychiatric/Behavioral: Negative for depression.   Past Medical History:  Diagnosis Date  . Anxiety   . Depression   . History of chicken pox   . Hypothyroid    h/o abnormal elevated tsh   . MVP (mitral valve prolapse) 2000   resolved 2005  . Ovarian cyst   . Vitamin D deficiency    Past Surgical History:  Procedure Laterality Date  . REDUCTION MAMMAPLASTY Bilateral 08/2015  . wisdom teeth removal     Family History  Problem Relation Age of Onset  . Hypertension Mother   . Arthritis Mother   . Depression Mother   . Hyperlipidemia Mother   . Miscarriages / [place] Mother   . Thyroid disease Mother   . Breast cancer Paternal Grandmother 66  . Cancer Paternal Grandmother        late 71s   . Diabetes type II Maternal Grandmother   . Arthritis Maternal Grandmother   . Depression Maternal Grandmother   . Diabetes Maternal Grandmother   . Hyperlipidemia Maternal Grandmother   . Hypertension Maternal Grandmother   . Intellectual disability Maternal Grandmother   . Arthritis Father   . Hypertension Father   . Hyperlipidemia Maternal Grandfather   . Cancer Maternal Grandfather        SCC mohs   . Diabetes Paternal Grandfather   . Heart disease Paternal Grandfather    Social History   Socioeconomic History  . Marital status: Divorced    Spouse  name: Not on file  . Number of children: Not on file  . Years of education: Not on file  . Highest education level: Not on file  Occupational History  . Not on file  Social Needs  . Financial resource strain: Not on file  . Food insecurity:    Worry: Not on file    Inability: Not on file  . Transportation needs:    Medical: Not on file    Non-medical: Not on file  Tobacco Use  . Smoking status: Never Smoker  . Smokeless tobacco: Never Used  Substance and Sexual Activity  . Alcohol use: No  . Drug use: No  . Sexual activity: Not Currently    Birth control/protection: Pill  Lifestyle  . Physical activity:    Days per week: Not on file    Minutes per session: Not on file  . Stress: Not on file  Relationships  . Social connections:    Talks on phone: Not on file    Gets together: Not on file    Attends religious service: Not on file    Active member of club or organization: Not on file    Attends meetings of clubs or organizations: Not on file    Relationship status: Not on file  . Intimate partner violence:    Fear of current or ex partner: Not  on file    Emotionally abused: Not on file    Physically abused: Not on file    Forced sexual activity: Not on file  Other Topics Concern  . 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 cholecalciferol (VITAMIN D) 1000 UNITS tablet Take 2 Units by mouth daily.  Marland Kitchen levonorgestrel-ethinyl estradiol (SEASONALE,INTROVALE,JOLESSA) 0.15-0.03 MG tablet Take 1 tablet by mouth daily. Use as directed  . levothyroxine (SYNTHROID, LEVOTHROID) 75 MCG tablet Take 1 tablet (75 mcg total) by mouth daily.  . sertraline (ZOLOFT) 50 MG tablet Take 1 tablet (50 mg total) by mouth daily.   Allergies  Allergen Reactions  . Penicillins Hives   Recent Results (from the past 2160 hour(s))   Cytology - PAP     Status: None   Collection Time: 05/18/18 12:00 AM  Result Value Ref Range   Adequacy      Satisfactory for evaluation  endocervical/transformation zone component PRESENT.   Diagnosis      NEGATIVE FOR INTRAEPITHELIAL LESIONS OR MALIGNANCY.   HPV NOT DETECTED     Comment: Normal Reference Range - NOT Detected   Material Submitted CervicoVaginal Pap [ThinPrep Imaged]    Objective  Body mass index is 29.59 kg/m. Wt Readings from Last 3 Encounters:  07/31/18 161 lb 12.8 oz (73.4 kg)  05/18/18 165 lb (74.8 kg)  05/06/18 159 lb 6.4 oz (72.3 kg)   Temp Readings from Last 3 Encounters:  07/31/18 98.2 F (36.8 C)  05/06/18 98.4 F (36.9 C) (Oral)  03/17/18 98.4 F (36.9 C) (Oral)   BP Readings from Last 3 Encounters:  07/31/18 138/80  05/18/18 108/80  05/06/18 136/86   Pulse Readings from Last 3 Encounters:  07/31/18 68  05/18/18 71  05/06/18 72    Physical Exam Vitals signs and nursing note reviewed.  Constitutional:      Appearance: Normal appearance. She is well-developed and well-groomed.  HENT:     Head: Normocephalic and atraumatic.     Nose: Nose normal.     Mouth/Throat:     Mouth: Mucous membranes are moist.  Eyes:     Conjunctiva/sclera: Conjunctivae normal.     Pupils: Pupils are equal, round, and reactive to light.  Cardiovascular:     Rate and Rhythm: Normal rate and regular rhythm.     Heart sounds: Normal heart sounds.  Pulmonary:     Effort: Pulmonary effort is normal.     Breath sounds: Normal breath sounds.  Skin:    General: Skin is warm and dry.  Neurological:     General: No focal deficit present.     Mental Status: She is alert and oriented to person, place, and time. Mental status is at baseline.     Gait: Gait normal.  Psychiatric:        Attention and Perception: Attention and perception normal.        Mood and Affect: Mood and affect normal.        Speech: Speech normal.        Behavior: Behavior normal. Behavior  is cooperative.        Thought Content: Thought content normal.        Cognition and Memory: Cognition and memory normal.  Judgment: Judgment normal.     Assessment   1. Annual  Plan  Flu shot had 02/19/18  Tdap due 08/2018 pt will come back  Hep B new vx today 1/2 today  MMR immune  mammogram 05/15/18 negative  Pap 05/18/18 neg HPV neg [facility]  Labs 04/04/2019  [facility] years ago no need to see now Cont healthy diet and exercise   Provider: Dr. Olivia Mackie McLean-Scocuzza-Internal Medicine

## 2018-09-03 ENCOUNTER — Ambulatory Visit: Payer: Federal, State, Local not specified - PPO

## 2018-10-21 ENCOUNTER — Ambulatory Visit (INDEPENDENT_AMBULATORY_CARE_PROVIDER_SITE_OTHER): Payer: Federal, State, Local not specified - PPO | Admitting: *Deleted

## 2018-10-21 ENCOUNTER — Other Ambulatory Visit: Payer: Self-pay

## 2018-10-21 DIAGNOSIS — Z23 Encounter for immunization: Secondary | ICD-10-CM | POA: Diagnosis not present

## 2019-03-16 ENCOUNTER — Other Ambulatory Visit: Payer: Self-pay | Admitting: Internal Medicine

## 2019-03-16 DIAGNOSIS — F419 Anxiety disorder, unspecified: Secondary | ICD-10-CM

## 2019-03-16 MED ORDER — SERTRALINE HCL 50 MG PO TABS: 50.0000 mg | ORAL_TABLET | Freq: Every day | ORAL | 3 refills | Status: DC

## 2019-04-01 ENCOUNTER — Other Ambulatory Visit: Payer: Self-pay

## 2019-04-05 ENCOUNTER — Other Ambulatory Visit (INDEPENDENT_AMBULATORY_CARE_PROVIDER_SITE_OTHER): Payer: Federal, State, Local not specified - PPO

## 2019-04-05 ENCOUNTER — Encounter: Payer: Self-pay | Admitting: Internal Medicine

## 2019-04-05 ENCOUNTER — Other Ambulatory Visit: Payer: Self-pay

## 2019-04-05 DIAGNOSIS — Z13220 Encounter for screening for lipoid disorders: Secondary | ICD-10-CM | POA: Diagnosis not present

## 2019-04-05 DIAGNOSIS — Z1329 Encounter for screening for other suspected endocrine disorder: Secondary | ICD-10-CM | POA: Diagnosis not present

## 2019-04-05 DIAGNOSIS — Z1389 Encounter for screening for other disorder: Secondary | ICD-10-CM | POA: Diagnosis not present

## 2019-04-05 DIAGNOSIS — Z1159 Encounter for screening for other viral diseases: Secondary | ICD-10-CM | POA: Diagnosis not present

## 2019-04-05 DIAGNOSIS — Z0184 Encounter for antibody response examination: Secondary | ICD-10-CM

## 2019-04-05 DIAGNOSIS — Z0000 Encounter for general adult medical examination without abnormal findings: Secondary | ICD-10-CM | POA: Diagnosis not present

## 2019-04-05 LAB — LIPID PANEL
Cholesterol: 215 mg/dL — ABNORMAL HIGH (ref 0–200)
HDL: 51.8 mg/dL (ref >39.00)
LDL Cholesterol: 131 mg/dL — ABNORMAL HIGH (ref 0–99)
NonHDL: 163.64
Total CHOL/HDL Ratio: 4
Triglycerides: 164 mg/dL — ABNORMAL HIGH (ref 0.0–149.0)
VLDL: 32.8 mg/dL (ref 0.0–40.0)

## 2019-04-05 LAB — CBC WITH DIFFERENTIAL/PLATELET
Basophils Absolute: 0.1 10*3/uL (ref 0.0–0.1)
Basophils Relative: 0.9 % (ref 0.0–3.0)
Eosinophils Absolute: 0.1 10*3/uL (ref 0.0–0.7)
Eosinophils Relative: 1.8 % (ref 0.0–5.0)
HCT: 41.8 % (ref 36.0–46.0)
Hemoglobin: 14.2 g/dL (ref 12.0–15.0)
Lymphocytes Relative: 41.7 % (ref 12.0–46.0)
Lymphs Abs: 2.3 10*3/uL (ref 0.7–4.0)
MCHC: 34.1 g/dL (ref 30.0–36.0)
MCV: 90.5 fl (ref 78.0–100.0)
Monocytes Absolute: 0.3 10*3/uL (ref 0.1–1.0)
Monocytes Relative: 5.1 % (ref 3.0–12.0)
Neutro Abs: 2.7 10*3/uL (ref 1.4–7.7)
Neutrophils Relative %: 50.5 % (ref 43.0–77.0)
Platelets: 257 10*3/uL (ref 150.0–400.0)
RBC: 4.62 Mil/uL (ref 3.87–5.11)
RDW: 13.4 % (ref 11.5–15.5)
WBC: 5.4 10*3/uL (ref 4.0–10.5)

## 2019-04-05 LAB — COMPREHENSIVE METABOLIC PANEL
ALT: 18 U/L (ref 0–35)
AST: 24 U/L (ref 0–37)
Albumin: 4.1 g/dL (ref 3.5–5.2)
Alkaline Phosphatase: 53 U/L (ref 39–117)
BUN: 14 mg/dL (ref 6–23)
CO2: 27 mEq/L (ref 19–32)
Calcium: 9.2 mg/dL (ref 8.4–10.5)
Chloride: 101 mEq/L (ref 96–112)
Creatinine, Ser: 0.82 mg/dL (ref 0.40–1.20)
GFR: 76.89 mL/min (ref >60.00)
Glucose, Bld: 82 mg/dL (ref 70–99)
Potassium: 4 mEq/L (ref 3.5–5.1)
Sodium: 137 mEq/L (ref 135–145)
Total Bilirubin: 0.5 mg/dL (ref 0.2–1.2)
Total Protein: 7 g/dL (ref 6.0–8.3)

## 2019-04-05 LAB — TSH: TSH: 5.59 u[IU]/mL — ABNORMAL HIGH (ref 0.35–4.50)

## 2019-04-06 ENCOUNTER — Other Ambulatory Visit: Payer: Self-pay | Admitting: Internal Medicine

## 2019-04-06 DIAGNOSIS — E039 Hypothyroidism, unspecified: Secondary | ICD-10-CM

## 2019-04-06 LAB — URINALYSIS, ROUTINE W REFLEX MICROSCOPIC
Bacteria, UA: NONE SEEN /HPF
Bilirubin Urine: NEGATIVE
Glucose, UA: NEGATIVE
Hgb urine dipstick: NEGATIVE
Hyaline Cast: NONE SEEN /LPF
Ketones, ur: NEGATIVE
Nitrite: NEGATIVE
Protein, ur: NEGATIVE
RBC / HPF: NONE SEEN /HPF (ref 0–2)
Specific Gravity, Urine: 1.009 (ref 1.001–1.03)
pH: 7 (ref 5.0–8.0)

## 2019-04-06 LAB — HEPATITIS B SURFACE ANTIBODY, QUANTITATIVE: Hep B S AB Quant (Post): 136 m[IU]/mL (ref >=10)

## 2019-04-06 MED ORDER — LEVOTHYROXINE SODIUM 88 MCG PO TABS: 88.0000 ug | ORAL_TABLET | Freq: Every day | ORAL | 3 refills | Status: DC

## 2019-04-07 ENCOUNTER — Telehealth: Payer: Self-pay | Admitting: *Deleted

## 2019-04-07 NOTE — Telephone Encounter (Signed)
Copied from [place] [id number]. Topic: Quick Communication - Office Called Patient (Clinic Use ONLY) >> Apr 07, 2019  1:58 PM Pauline Good wrote: Reason for CRM: pt returning call to office

## 2019-04-08 NOTE — Telephone Encounter (Signed)
Please see result note 

## 2019-04-09 ENCOUNTER — Other Ambulatory Visit: Payer: Self-pay

## 2019-04-09 ENCOUNTER — Encounter: Payer: Self-pay | Admitting: Internal Medicine

## 2019-04-09 ENCOUNTER — Ambulatory Visit (INDEPENDENT_AMBULATORY_CARE_PROVIDER_SITE_OTHER): Payer: Federal, State, Local not specified - PPO | Admitting: Internal Medicine

## 2019-04-09 VITALS — Ht 62.0 in | Wt 161.8 lb

## 2019-04-09 DIAGNOSIS — E785 Hyperlipidemia, unspecified: Secondary | ICD-10-CM | POA: Insufficient documentation

## 2019-04-09 DIAGNOSIS — E039 Hypothyroidism, unspecified: Secondary | ICD-10-CM | POA: Diagnosis not present

## 2019-04-09 NOTE — Progress Notes (Signed)
Virtual Visit via Video Note  I connected with Kaitlin Strickland  on 04/09/19 at  8:15 AM EST by a video enabled telemedicine application and verified that I am speaking with the correct person using two identifiers.  Location patient: home Location provider:work or home office Persons participating in the virtual visit: patient, provider  I discussed the limitations of evaluation and management by telemedicine and the availability of in person appointments. The patient expressed understanding and agreed to proceed.   HPI:  F/u  1. Hypothyroidism was on levo 75 but tsh 5.55 will increase dose  2. HLD exercising 4 miles 3x per week but diet during pandemic not healthing working to change this   ROS: See pertinent positives and negatives per HPI.  Past Medical History:  Diagnosis Date  . Anxiety   . Depression   . History of chicken pox   . Hypothyroid    h/o abnormal elevated tsh   . MVP (mitral valve prolapse) 2000   resolved 2005  . Ovarian cyst   . Vitamin D deficiency     Past Surgical History:  Procedure Laterality Date  . REDUCTION MAMMAPLASTY Bilateral 08/2015  . wisdom teeth removal      Family History  Problem Relation Age of Onset  . Hypertension Mother   . Arthritis Mother   . Depression Mother   . Hyperlipidemia Mother   . Miscarriages / [place] Mother   . Thyroid disease Mother   . Cancer Mother        uterine stage 3 dx'ed age 18 s/p hysterectomy/chemo neg genetic testing  . Breast cancer Paternal Grandmother 76  . Cancer Paternal Grandmother        late 38s   . Diabetes type II Maternal Grandmother   . Arthritis Maternal Grandmother   . Depression Maternal Grandmother   . Diabetes Maternal Grandmother   . Hyperlipidemia Maternal Grandmother   . Hypertension Maternal Grandmother   . Intellectual disability Maternal Grandmother   . Arthritis Father   . Hypertension Father   . Hyperlipidemia Maternal Grandfather   . Cancer Maternal Grandfather    SCC mohs   . Diabetes Paternal Grandfather   . 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  cholecalciferol (VITAMIN D) 1000 UNITS tablet, Take 2 Units by mouth daily., Disp: , Rfl:  .  levonorgestrel-ethinyl estradiol (SEASONALE,INTROVALE,JOLESSA) 0.15-0.03 MG tablet, Take 1 tablet by mouth daily. Use as directed, Disp: 1 Package, Rfl: 3 .  levothyroxine (SYNTHROID) 88 MCG tablet, Take 1 tablet (88 mcg total) by mouth daily before breakfast. 30 minutes before breakfast, Disp: 90 tablet, Rfl: 3 .  sertraline (ZOLOFT) 50 MG tablet, Take 1 tablet (50 mg total) by mouth daily., Disp: 90 tablet, Rfl: 3  EXAM:  VITALS per patient if applicable:  GENERAL: alert, oriented, appears well and in no acute distress  HEENT: atraumatic, conjunttiva clear, no obvious abnormalities on inspection of external nose and ears  NECK: normal movements of the head and neck  LUNGS: on inspection no signs of respiratory distress, breathing rate appears normal, no obvious gross SOB, gasping or wheezing  CV: no obvious cyanosis  MS: moves all visible extremities without noticeable abnormality  PSYCH/NEURO: pleasant and cooperative, no obvious depression or anxiety, speech and thought processing grossly intact  ASSESSMENT AND PLAN:  Discussed the following assessment and plan:  Hypothyroidism, unspecified type - Plan: TSH in levo 88 mcg qd   Hyperlipidemia, unspecified hyperlipidemia type rec healthy diet and exercise  HM Flu shot had work [place] 02/26/19  Tdap utd  Hep B new vx today 1/2 today  MMR immune  mammogram 05/15/18 negative sch 05/21/2019  Pap 05/18/18 neg HPV neg [facility] 05/24/19  Labs 04/04/2019  [facility] no need to see now Cont healthy diet and exercise    -we discussed possible  serious and likely etiologies, options for evaluation and workup, limitations of telemedicine visit vs in person visit, treatment, treatment risks and precautions. Pt prefers to treat via telemedicine empirically rather then risking or undertaking an in person visit at this moment. Patient agrees to seek prompt in person care if worsening, new symptoms arise, or if is not improving with treatment.   I discussed the assessment and treatment plan with the patient. The patient was provided an opportunity to ask questions and all were answered. The patient agreed with the plan and demonstrated an understanding of the instructions.   The patient was advised to call back or seek an in-person evaluation if the symptoms worsen or if the condition fails to improve as anticipated.  Time spent 15 minutes  Delorise Jackson, MD

## 2019-04-09 NOTE — Patient Instructions (Signed)
High Cholesterol  High cholesterol is a condition in which the blood has high levels of a white, waxy, fat-like substance (cholesterol). The human body needs small amounts of cholesterol. The liver makes all the cholesterol that the body needs. Extra (excess) cholesterol comes from the food that we eat. Cholesterol is carried from the liver by the blood through the blood vessels. If you have high cholesterol, deposits (plaques) may build up on the walls of your blood vessels (arteries). Plaques make the arteries narrower and stiffer. Cholesterol plaques increase your risk for heart attack and stroke. Work with your health care provider to keep your cholesterol levels in a healthy range. What increases the risk? This condition is more likely to develop in people who:  Eat foods that are high in animal fat (saturated fat) or cholesterol.  Are overweight.  Are not getting enough exercise.  Have a family history of high cholesterol. What are the signs or symptoms? There are no symptoms of this condition. How is this diagnosed? This condition may be diagnosed from the results of a blood test.  If you are older than age 20, your health care provider may check your cholesterol every 4-6 years.  You may be checked more often if you already have high cholesterol or other risk factors for heart disease. The blood test for cholesterol measures:  "Bad" cholesterol (LDL cholesterol). This is the main type of cholesterol that causes heart disease. The desired level for LDL is less than 100.  "Good" cholesterol (HDL cholesterol). This type helps to protect against heart disease by cleaning the arteries and carrying the LDL away. The desired level for HDL is 60 or higher.  Triglycerides. These are fats that the body can store or burn for energy. The desired number for triglycerides is lower than 150.  Total cholesterol. This is a measure of the total amount of cholesterol in your blood, including LDL  cholesterol, HDL cholesterol, and triglycerides. A healthy number is less than 200. How is this treated? This condition is treated with diet changes, lifestyle changes, and medicines. Diet changes  This may include eating more whole grains, fruits, vegetables, nuts, and fish.  This may also include cutting back on red meat and foods that have a lot of added sugar. Lifestyle changes  Changes may include getting at least 40 minutes of aerobic exercise 3 times a week. Aerobic exercises include walking, biking, and swimming. Aerobic exercise along with a healthy diet can help you maintain a healthy weight.  Changes may also include quitting smoking. Medicines  Medicines are usually given if diet and lifestyle changes have failed to reduce your cholesterol to healthy levels.  Your health care provider may prescribe a statin medicine. Statin medicines have been shown to reduce cholesterol, which can reduce the risk of heart disease. Follow these instructions at home: Eating and drinking If told by your health care provider:  Eat chicken (without skin), fish, veal, shellfish, ground turkey breast, and round or loin cuts of red meat.  Do not eat fried foods or fatty meats, such as hot dogs and salami.  Eat plenty of fruits, such as apples.  Eat plenty of vegetables, such as broccoli, potatoes, and carrots.  Eat beans, peas, and lentils.  Eat grains such as barley, rice, couscous, and bulgur wheat.  Eat pasta without cream sauces.  Use skim or nonfat milk, and eat low-fat or nonfat yogurt and cheeses.  Do not eat or drink whole milk, cream, ice cream, egg yolks,   or hard cheeses.  Do not eat stick margarine or tub margarines that contain trans fats (also called partially hydrogenated oils).  Do not eat saturated tropical oils, such as coconut oil and palm oil.  Do not eat cakes, cookies, crackers, or other baked goods that contain trans fats.  General instructions  Exercise as  directed by your health care provider. Increase your activity level with activities such as gardening, walking, and taking the stairs.  Take over-the-counter and prescription medicines only as told by your health care provider.  Do not use any products that contain nicotine or tobacco, such as cigarettes and e-cigarettes. If you need help quitting, ask your health care provider.  Keep all follow-up visits as told by your health care provider. This is important. Contact a health care provider if:  You are struggling to maintain a healthy diet or weight.  You need help to start on an exercise program.  You need help to stop smoking. Get help right away if:  You have chest pain.  You have trouble breathing. This information is not intended to replace advice given to you by your health care provider. Make sure you discuss any questions you have with your health care provider. Document Released: 05/20/2005 Document Revised: 05/23/2017 Document Reviewed: 11/18/2015 Elsevier Patient Education  [place]. Cholesterol Content in Foods Cholesterol is a waxy, fat-like substance that helps to carry fat in the blood. The body needs cholesterol in small amounts, but too much cholesterol can cause damage to the arteries and heart. Most people should eat less than 200 milligrams (mg) of cholesterol a day. Foods with cholesterol  Cholesterol is found in animal-based foods, such as meat, seafood, and dairy. Generally, low-fat dairy and lean meats have less cholesterol than full-fat dairy and fatty meats. The milligrams of cholesterol per serving (mg per serving) of common cholesterol-containing foods are listed below. Meat and other proteins  Egg - one large whole egg has 186 mg.  Veal shank - 4 oz has 141 mg.  Lean ground [place] (93% lean) - 4 oz has 118 mg.  Fat-trimmed lamb loin - 4 oz has 106 mg.  Lean ground beef (90% lean) - 4 oz has 100 mg.  Lobster - 3.5 oz has 90 mg.  Pork  loin chops - 4 oz has 86 mg.  Canned salmon - 3.5 oz has 83 mg.  Fat-trimmed beef top loin - 4 oz has 78 mg.  Frankfurter - 1 frank (3.5 oz) has 77 mg.  Crab - 3.5 oz has 71 mg.  Roasted chicken without skin, white meat - 4 oz has 66 mg.  Light bologna - 2 oz has 45 mg.  Deli-cut [place] - 2 oz has 31 mg.  Canned tuna - 3.5 oz has 31 mg.  Bacon - 1 oz has 29 mg.  Oysters and mussels (raw) - 3.5 oz has 25 mg.  Mackerel - 1 oz has 22 mg.  Trout - 1 oz has 20 mg.  Pork sausage - 1 link (1 oz) has 17 mg.  Salmon - 1 oz has 16 mg.  Tilapia - 1 oz has 14 mg. Dairy  Soft-serve ice cream -  cup (4 oz) has 103 mg.  Whole-milk yogurt - 1 cup (8 oz) has 29 mg.  Cheddar cheese - 1 oz has 28 mg.  American cheese - 1 oz has 28 mg.  Whole milk - 1 cup (8 oz) has 23 mg.  2% milk - 1 cup (8 oz)  has 18 mg.  Cream cheese - 1 tablespoon (Tbsp) has 15 mg.  Cottage cheese -  cup (4 oz) has 14 mg.  Low-fat (1%) milk - 1 cup (8 oz) has 10 mg.  Sour cream - 1 Tbsp has 8.5 mg.  Low-fat yogurt - 1 cup (8 oz) has 8 mg.  Nonfat Greek yogurt - 1 cup (8 oz) has 7 mg.  Half-and-half cream - 1 Tbsp has 5 mg. Fats and oils  Cod liver oil - 1 tablespoon (Tbsp) has 82 mg.  Butter - 1 Tbsp has 15 mg.  Lard - 1 Tbsp has 14 mg.  Bacon grease - 1 Tbsp has 14 mg.  Mayonnaise - 1 Tbsp has 5-10 mg.  Margarine - 1 Tbsp has 3-10 mg. Exact amounts of cholesterol in these foods may vary depending on specific ingredients and brands. Foods without cholesterol Most plant-based foods do not have cholesterol unless you combine them with a food that has cholesterol. Foods without cholesterol include:  Grains and cereals.  Vegetables.  Fruits.  Vegetable oils, such as olive, canola, and sunflower oil.  Legumes, such as peas, beans, and lentils.  Nuts and seeds.  Egg whites. Summary  The body needs cholesterol in small amounts, but too much cholesterol can cause damage to the  arteries and heart.  Most people should eat less than 200 milligrams (mg) of cholesterol a day. This information is not intended to replace advice given to you by your health care provider. Make sure you discuss any questions you have with your health care provider. Document Released: 01/14/2017 Document Revised: 05/02/2017 Document Reviewed: 01/14/2017 Elsevier Patient Education  2020 [employer].

## 2019-05-21 ENCOUNTER — Other Ambulatory Visit (INDEPENDENT_AMBULATORY_CARE_PROVIDER_SITE_OTHER): Payer: Federal, State, Local not specified - PPO

## 2019-05-21 ENCOUNTER — Ambulatory Visit
Admission: RE | Admit: 2019-05-21 | Discharge: 2019-05-21 | Disposition: A | Discharge status: Home / Self Care | Payer: Federal, State, Local not specified - PPO | Source: Ambulatory Visit | Attending: Internal Medicine | Admitting: Internal Medicine

## 2019-05-21 ENCOUNTER — Other Ambulatory Visit: Payer: Self-pay

## 2019-05-21 DIAGNOSIS — Z1231 Encounter for screening mammogram for malignant neoplasm of breast: Secondary | ICD-10-CM | POA: Insufficient documentation

## 2019-05-21 DIAGNOSIS — E039 Hypothyroidism, unspecified: Secondary | ICD-10-CM

## 2019-05-21 LAB — TSH: TSH: 4.09 u[IU]/mL (ref 0.35–4.50)

## 2019-05-24 ENCOUNTER — Ambulatory Visit: Payer: Federal, State, Local not specified - PPO | Admitting: Obstetrics and Gynecology

## 2019-05-26 ENCOUNTER — Other Ambulatory Visit: Payer: Self-pay

## 2019-05-26 ENCOUNTER — Encounter: Payer: Self-pay | Admitting: Obstetrics and Gynecology

## 2019-05-26 ENCOUNTER — Ambulatory Visit (INDEPENDENT_AMBULATORY_CARE_PROVIDER_SITE_OTHER): Payer: Federal, State, Local not specified - PPO | Admitting: Obstetrics and Gynecology

## 2019-05-26 VITALS — BP 130/80 | Ht 62.0 in | Wt 175.0 lb

## 2019-05-26 DIAGNOSIS — N760 Acute vaginitis: Secondary | ICD-10-CM

## 2019-05-26 DIAGNOSIS — B9689 Other specified bacterial agents as the cause of diseases classified elsewhere: Secondary | ICD-10-CM | POA: Diagnosis not present

## 2019-05-26 DIAGNOSIS — Z01419 Encounter for gynecological examination (general) (routine) without abnormal findings: Secondary | ICD-10-CM

## 2019-05-26 DIAGNOSIS — Z1231 Encounter for screening mammogram for malignant neoplasm of breast: Secondary | ICD-10-CM

## 2019-05-26 DIAGNOSIS — Z3041 Encounter for surveillance of contraceptive pills: Secondary | ICD-10-CM

## 2019-05-26 HISTORY — DX: Other specified bacterial agents as the cause of diseases classified elsewhere: B96.89

## 2019-05-26 LAB — POCT WET PREP WITH KOH
Clue Cells Wet Prep HPF POC: POSITIVE
KOH Prep POC: POSITIVE — AB
Trichomonas, UA: NEGATIVE
Yeast Wet Prep HPF POC: NEGATIVE

## 2019-05-26 MED ORDER — LEVONORGEST-ETH ESTRAD 91-DAY 0.15-0.03 MG PO TABS: 1.0000 | ORAL_TABLET | Freq: Every day | ORAL | 3 refills | Status: DC

## 2019-05-26 MED ORDER — METRONIDAZOLE 0.75 % VA GEL: 1.0000 | Freq: Every day | VAGINAL | 0 refills | Status: AC

## 2019-05-26 NOTE — Progress Notes (Signed)
PCP:  McLean-Scocuzza, Nino Glow, MD   Chief Complaint  Patient presents with  . Gynecologic Exam  . Vaginal Itching    abnormal odor, no discharge or irritation x 1 month     HPI:      Ms. Kaitlin Strickland is a 43 y.o. No obstetric history on file. who LMP was Patient's last menstrual period was 03/26/2019 (approximate)., presents today for her annual examination.  Her menses are Q3 months with OCPs, lasting 4 days.  Dysmenorrhea none. She does not have intermenstrual bleeding.  Sex activity: currently sexually active--contraception OCPs.  Last Pap: 05/18/18  Results were: no abnormalities /neg HPV DNA  Hx of STDs: none  Pt has noted vaginal itching, particularly after running. Area looks red and hot, but no other findings. No increased discharge but does have a urine type odor. Doesn't notice urine incont. Wears a pantyliner daily due to odor. Uses scented body wash and dryer sheets. Hx of BV in distant past.  Mammo: 05/21/19 with PCP. Results: normal. Repeat in 1 yr. There is a FH of breast cancer in her PGM, mat grt aunt, genetic testing not indicated. There is no FH of ovarian cancer. Her mom was diagnosed with uterine cancer this yr and was "gene neg". The patient does do self-breast exams.  Tobacco use: The patient denies current or previous tobacco use. Alcohol use: none No drug use.  Exercise: moderately active  She does get adequate calcium and Vitamin D in her diet.  She is on zoloft for anxiety with sx control. No side effects. She would like to continue. She also takes Levo 75 mcg daily for hypothyroidism. Labs were WNL  with PCP 11/19. Both meds Rxd by PCP now.   Past Medical History:  Diagnosis Date  . Anxiety   . Depression   . History of chicken pox   . Hypothyroid    h/o abnormal elevated tsh   . MVP (mitral valve prolapse) 2000   resolved 2005  . Ovarian cyst   . Vitamin D deficiency     Past Surgical History:  Procedure Laterality Date  . REDUCTION  MAMMAPLASTY Bilateral 08/2015  . wisdom teeth removal      Family History  Problem Relation Age of Onset  . Hypertension Mother   . Arthritis Mother   . Depression Mother   . Hyperlipidemia Mother   . Miscarriages / [place] Mother   . Thyroid disease Mother   . Uterine cancer Mother        stage 33 dx'ed age 68 s/p hysterectomy/chemo; neg genetic testing  . Breast cancer Paternal Grandmother 26  . Cancer Paternal Grandmother        late 51s   . Diabetes type II Maternal Grandmother   . Arthritis Maternal Grandmother   . Depression Maternal Grandmother   . Diabetes Maternal Grandmother   . Hyperlipidemia Maternal Grandmother   . Hypertension Maternal Grandmother   . Intellectual disability Maternal Grandmother   . Arthritis Father   . Hypertension Father   . Hyperlipidemia Maternal Grandfather   . Cancer Maternal Grandfather        SCC mohs   . Diabetes Paternal Grandfather   . Heart disease Paternal Grandfather     Social History   Socioeconomic History  . Marital status: Divorced    Spouse name: Not on file  . Number of children: Not on file  . Years of education: Not on file  . Highest education level: Not on file  Occupational History  . Not on file  Tobacco Use  . Smoking status: Never Smoker  . Smokeless tobacco: Never Used  Substance and Sexual Activity  . Alcohol use: No  . Drug use: No  . Sexual activity: Yes    Birth control/protection: Pill  Other Topics Concern  . 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 Difficulty of Paying Living Expenses: Not on file  Food Insecurity:   . Worried About Charity fundraiser in the Last Year: Not on file  . Ran Out of Food in the Last Year: Not on file  Transportation Needs:   . Lack of Transportation  (Medical): Not on file  . Lack of Transportation (Non-Medical): Not on file  Physical Activity:   . Days of Exercise per Week: Not on file  . Minutes of Exercise per Session: Not on file  Stress:   . Feeling of Stress : Not on file  Social Connections:   . Frequency of Communication with Friends and Family: Not on file  . Frequency of Social Gatherings with Friends and Family: Not on file  . Attends Religious Services: Not on file  . Active Member of Clubs or Organizations: Not on file  . Attends Archivist Meetings: Not on file  . Marital Status: Not on file  Intimate Partner Violence:   . Fear of Current or Ex-Partner: Not on file  . Emotionally Abused: Not on file  . Physically Abused: Not on file  . Sexually Abused: Not on file    Current Meds  Medication Sig  . cholecalciferol (VITAMIN D) 1000 UNITS tablet Take 2 Units by mouth daily.  Marland Kitchen levonorgestrel-ethinyl estradiol (SEASONALE) 0.15-0.03 MG tablet Take 1 tablet by mouth daily. Use as directed  . levothyroxine (SYNTHROID) 88 MCG tablet Take 1 tablet (88 mcg total) by mouth daily before breakfast. 30 minutes before breakfast  . sertraline (ZOLOFT) 50 MG tablet Take 1 tablet (50 mg total) by mouth daily.  . [DISCONTINUED] levonorgestrel-ethinyl estradiol (SEASONALE,INTROVALE,JOLESSA) 0.15-0.03 MG tablet Take 1 tablet by mouth daily. Use as directed     ROS:  Review of Systems  Constitutional: Negative for fatigue, fever and unexpected weight change.  Respiratory: Negative for cough, shortness of breath and wheezing.   Cardiovascular: Negative for chest pain, palpitations and leg swelling.  Gastrointestinal: Negative for blood in stool, constipation, diarrhea, nausea and vomiting.  Endocrine: Negative for cold intolerance, heat intolerance and polyuria.  Genitourinary: Negative for dyspareunia, dysuria, flank pain, frequency, genital sores, hematuria, menstrual problem, pelvic pain, urgency, vaginal bleeding,  vaginal discharge and vaginal pain.  Musculoskeletal: Negative for back pain, joint swelling and myalgias.  Skin: Negative for rash.  Neurological: Negative for dizziness, syncope, light-headedness, numbness and headaches.  Hematological: Negative for adenopathy.  Psychiatric/Behavioral: Negative for agitation, confusion, sleep disturbance and suicidal ideas. The patient is not nervous/anxious.      Objective: BP 130/80   Ht 5\' 2"  (1.575 m)   Wt 175 lb (79.4 kg)   LMP 03/26/2019 (Approximate)   BMI 32.01 kg/m    Physical Exam Constitutional:      Appearance: She is well-developed.  Genitourinary:     Vulva, cervix, uterus, right adnexa and left  adnexa normal.     No vulval lesion or tenderness noted.     Vaginal discharge present.     No vaginal erythema or tenderness.     No cervical polyp.     Uterus is not enlarged or tender.     No right or left adnexal mass present.     Right adnexa not tender.     Left adnexa not tender.     Genitourinary Comments: NEG EXT VULVAR EXAM FOR ITCH  Neck:     Thyroid: No thyromegaly.  Cardiovascular:     Rate and Rhythm: Normal rate and regular rhythm.     Heart sounds: Normal heart sounds. No murmur.  Pulmonary:     Effort: Pulmonary effort is normal.     Breath sounds: Normal breath sounds.  Chest:     Breasts:        Right: No mass, nipple discharge, skin change or tenderness.        Left: No mass, nipple discharge, skin change or tenderness.  Abdominal:     Palpations: Abdomen is soft.     Tenderness: There is no abdominal tenderness. There is no guarding.  Musculoskeletal:        General: Normal range of motion.     Cervical back: Normal range of motion.  Neurological:     General: No focal deficit present.     Mental Status: She is alert and oriented to person, place, and time.     Cranial Nerves: No cranial nerve deficit.  Skin:    General: Skin is warm and dry.  Psychiatric:        Mood and Affect: Mood normal.         Behavior: Behavior normal.        Thought Content: Thought content normal.        Judgment: Judgment normal.  Vitals reviewed.    RESULTS: Results for orders placed or performed in visit on 05/26/19 (from the past 24 hour(s))  POCT Wet Prep with KOH     Status: Abnormal   Collection Time: 05/26/19  4:02 PM  Result Value Ref Range   Trichomonas, UA Negative    Clue Cells Wet Prep HPF POC pos    Epithelial Wet Prep HPF POC     Yeast Wet Prep HPF POC neg    Bacteria Wet Prep HPF POC     RBC Wet Prep HPF POC     WBC Wet Prep HPF POC     KOH Prep POC Positive (A) Negative    Assessment/Plan: Encounter for annual routine gynecological examination  Encounter for surveillance of contraceptive pills - Plan: levonorgestrel-ethinyl estradiol (SEASONALE) 0.15-0.03 MG tablet; OCP RF  Encounter for screening mammogram for malignant neoplasm of breast; pt current on mammo  Bacterial vaginosis - Plan: metroNIDAZOLE (METROGEL) 0.75 % vaginal gel, POCT Wet Prep with KOH; Rx metrogel (pt pref). F/u prn. Dove sens skin soap/line dry underwear. F/u prn.   Meds ordered this encounter  Medications  . metroNIDAZOLE (METROGEL) 0.75 % vaginal gel    Sig: Place 1 Applicatorful vaginally at bedtime for 5 days.    Dispense:  50 g    Refill:  0    Order Specific Question:   Supervising Provider    Answer:   Gae Dry [id number]  . levonorgestrel-ethinyl estradiol (SEASONALE) 0.15-0.03 MG tablet    Sig: Take 1 tablet by mouth daily. Use as directed    Dispense:  1 Package  Refill:  3    Order Specific Question:   Supervising Provider    Answer:   Gae Dry [id number]             GYN counsel adequate intake of calcium and vitamin D, diet and exercise     F/U  Return in about 1 year (around 05/25/2020).  Erilyn Pearman B. Danyiel Crespin, PA-C 05/26/2019 4:02 PM

## 2019-05-26 NOTE — Patient Instructions (Signed)
I value your feedback and entrusting us with your care. If you get a [facility] patient survey, I would appreciate you taking the time to let us know about your experience today. Thank you!  As of May 13, 2019, your lab results will be released to your MyChart immediately, before I even have a chance to see them. Please give me time to review them and contact you if there are any abnormalities. Thank you for your patience.  

## 2019-06-24 ENCOUNTER — Ambulatory Visit: Payer: Federal, State, Local not specified - PPO | Admitting: Internal Medicine

## 2019-07-05 ENCOUNTER — Encounter: Payer: Self-pay | Admitting: Obstetrics and Gynecology

## 2019-07-05 ENCOUNTER — Other Ambulatory Visit: Payer: Self-pay

## 2019-07-05 ENCOUNTER — Ambulatory Visit (INDEPENDENT_AMBULATORY_CARE_PROVIDER_SITE_OTHER): Payer: Federal, State, Local not specified - PPO | Admitting: Obstetrics and Gynecology

## 2019-07-05 VITALS — BP 110/70 | Ht 62.0 in | Wt 178.0 lb

## 2019-07-05 DIAGNOSIS — T300 Burn of unspecified body region, unspecified degree: Secondary | ICD-10-CM

## 2019-07-05 DIAGNOSIS — X19XXXA Contact with other heat and hot substances, initial encounter: Secondary | ICD-10-CM | POA: Diagnosis not present

## 2019-07-05 DIAGNOSIS — T2101XA Burn of unspecified degree of chest wall, initial encounter: Secondary | ICD-10-CM

## 2019-07-05 DIAGNOSIS — N611 Abscess of the breast and nipple: Secondary | ICD-10-CM

## 2019-07-05 MED ORDER — DOXYCYCLINE HYCLATE 100 MG PO CAPS: 100.0000 mg | ORAL_CAPSULE | Freq: Two times a day (BID) | ORAL | 0 refills | Status: AC

## 2019-07-05 NOTE — Progress Notes (Signed)
McLean-Scocuzza, Nino Glow, MD   Chief Complaint  Patient presents with  . Breast Exam    LB, sore, top layer of skin has come off    HPI:      Ms. Kaitlin Strickland is a 44 y.o. G1P1001 who LMP was Patient's last menstrual period was 06/04/2019 (approximate)., presents today for LT breast infection. Has a resolved little breast lesion LT breast near areola that comes and goes. Noted itching in that area last wk. Then started noticing some redness, tenderness and heat surrounding lesion about 4 days ago, although lesion still resolved. Using warm compresses/heating pad. Noted a layer of skin come off her LT breast outer quadrant this morning. Treated with neosporin/gauze. No pain. S/p breast reduction in past.  Neg mammo 05/21/19; no FH breast/ovar cancer.  Pt also treated for BV with metrogel 12/20. Sx improved but still noting a scent. Not taking probiotics, not using condoms.   Patient Active Problem List   Diagnosis Date Noted  . Bacterial vaginosis 05/26/2019  . Hyperlipidemia 04/09/2019  . Annual physical exam 07/31/2018  . Anxiety 03/17/2018  . Vitamin D deficiency 03/17/2018  . Hypothyroidism 03/17/2018    Past Surgical History:  Procedure Laterality Date  . REDUCTION MAMMAPLASTY Bilateral 08/2015  . wisdom teeth removal      Family History  Problem Relation Age of Onset  . Hypertension Mother   . Arthritis Mother   . Depression Mother   . Hyperlipidemia Mother   . Miscarriages / [place] Mother   . Thyroid disease Mother   . Uterine cancer Mother        stage 5 dx'ed age 5 s/p hysterectomy/chemo; neg genetic testing  . Breast cancer Paternal Grandmother 69  . Cancer Paternal Grandmother        late 30s   . Diabetes type II Maternal Grandmother   . Arthritis Maternal Grandmother   . Depression Maternal Grandmother   . Diabetes Maternal Grandmother   . Hyperlipidemia Maternal Grandmother   . Hypertension Maternal Grandmother   . Intellectual disability  Maternal Grandmother   . Arthritis Father   . Hypertension Father   . Hyperlipidemia Maternal Grandfather   . Cancer Maternal Grandfather        SCC mohs   . Diabetes Paternal Grandfather   . Heart disease Paternal Grandfather     Social History   Socioeconomic History  . Marital status: Divorced    Spouse name: Not on file  . Number of children: Not on file  . Years of education: Not on file  . Highest education level: Not on file  Occupational History  . Not on file  Tobacco Use  . Smoking status: Never Smoker  . Smokeless tobacco: Never Used  Substance and Sexual Activity  . Alcohol use: No  . Drug use: No  . Sexual activity: Yes    Birth control/protection: Pill  Other Topics Concern  . 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 Difficulty of Paying Living Expenses: Not on file  Food Insecurity:   . Worried About Charity fundraiser in the Last Year: Not on file  . Ran Out of Food in the Last Year: Not on file  Transportation Needs:   . Film/video editor (Medical): Not on file  . Lack of Transportation (Non-Medical): Not on file  Physical Activity:   . Days of Exercise per Week: Not on file  . Minutes of Exercise per Session: Not on file  Stress:   . Feeling of Stress : Not on file  Social Connections:   . Frequency of Communication with Friends and Family: Not on file  . Frequency of Social Gatherings with Friends and Family: Not on file  . Attends Religious Services: Not on file  . Active Member of Clubs or Organizations: Not on file  . Attends Archivist Meetings: Not on file  . Marital Status: Not on file  Intimate Partner Violence:   . Fear of Current or Ex-Partner: Not on file  . Emotionally Abused: Not on file  . Physically Abused: Not on  file  . Sexually Abused: Not on file    Outpatient Medications Prior to Visit  Medication Sig Dispense Refill  . cholecalciferol (VITAMIN D) 1000 UNITS tablet Take 2 Units by mouth daily.    Marland Kitchen levonorgestrel-ethinyl estradiol (SEASONALE) 0.15-0.03 MG tablet Take 1 tablet by mouth daily. Use as directed 1 Package 3  . levothyroxine (SYNTHROID) 88 MCG tablet Take 1 tablet (88 mcg total) by mouth daily before breakfast. 30 minutes before breakfast 90 tablet 3  . sertraline (ZOLOFT) 50 MG tablet Take 1 tablet (50 mg total) by mouth daily. 90 tablet 3   No facility-administered medications prior to visit.      ROS:  Review of Systems  Constitutional: Negative for fever.  Gastrointestinal: Negative for blood in stool, constipation, diarrhea, nausea and vomiting.  Genitourinary: Negative for dyspareunia, dysuria, flank pain, frequency, hematuria, urgency, vaginal bleeding, vaginal discharge and vaginal pain.  Musculoskeletal: Negative for back pain.  Skin: Negative for rash.   BREAST: pain/discoloration   OBJECTIVE:   Vitals:  BP 110/70   Ht 5\' 2"  (1.575 m)   Wt 178 lb (80.7 kg)   LMP 06/04/2019 (Approximate)   BMI 32.56 kg/m   Physical Exam Vitals reviewed.  Pulmonary:     Effort: Pulmonary effort is normal.  Chest:     Breasts: Breasts are symmetrical.        Right: No inverted nipple, mass, nipple discharge, skin change or tenderness.        Left: Swelling, skin change and tenderness present. No inverted nipple, mass or nipple discharge.    Musculoskeletal:        General: Normal range of motion.     Cervical back: Normal range of motion.  Skin:    General: Skin is warm and dry.  Neurological:     General: No focal deficit present.     Mental Status: She is alert and oriented to person, place, and time.     Cranial Nerves: No cranial nerve deficit.  Psychiatric:        Mood and Affect: Mood normal.        Behavior: Behavior normal.        Thought Content:  Thought content normal.        Judgment: Judgment normal.     Assessment/Plan: Breast abscess - Plan: doxycycline (VIBRAMYCIN) 100 MG capsule; Rx doxy, warm compresses (no heating pad) on small area only. RTO in 2 wks for further eval/sooner prn.  Skin burn--LT outer quadrant, most likely from heating pad. Triple abx oint/gauze. Rechk in wks. F/u sooner prn.  Vaginal scent after BV 12/20--add probiotics/condoms. Try  repHresh. F/u if sx persist at 2 wk f/u appt.   Meds ordered this encounter  Medications  . doxycycline (VIBRAMYCIN) 100 MG capsule    Sig: Take 1 capsule (100 mg total) by mouth 2 (two) times daily for 10 days.    Dispense:  20 capsule    Refill:  0    Order Specific Question:   Supervising Provider    Answer:   Gae Dry [id number]      Return in about 2 weeks (around 07/19/2019) for breast exam f/u.  Jahmeir Geisen B. Aster Eckrich, PA-C 07/05/2019 12:01 PM

## 2019-07-05 NOTE — Patient Instructions (Signed)
I value your feedback and entrusting us with your care. If you get a [facility] patient survey, I would appreciate you taking the time to let us know about your experience today. Thank you!  As of May 13, 2019, your lab results will be released to your MyChart immediately, before I even have a chance to see them. Please give me time to review them and contact you if there are any abnormalities. Thank you for your patience.  

## 2019-07-19 ENCOUNTER — Other Ambulatory Visit: Payer: Self-pay

## 2019-07-19 ENCOUNTER — Encounter: Payer: Self-pay | Admitting: Obstetrics and Gynecology

## 2019-07-19 ENCOUNTER — Ambulatory Visit (INDEPENDENT_AMBULATORY_CARE_PROVIDER_SITE_OTHER): Payer: Federal, State, Local not specified - PPO | Admitting: Obstetrics and Gynecology

## 2019-07-19 VITALS — BP 100/60 | Ht 62.0 in | Wt 180.0 lb

## 2019-07-19 DIAGNOSIS — B354 Tinea corporis: Secondary | ICD-10-CM | POA: Diagnosis not present

## 2019-07-19 MED ORDER — CLOTRIMAZOLE-BETAMETHASONE 1-0.05 % EX CREA: TOPICAL_CREAM | CUTANEOUS | 0 refills | Status: DC

## 2019-07-19 NOTE — Progress Notes (Signed)
McLean-Scocuzza, Nino Glow, MD   Chief Complaint  Patient presents with  . Follow-up    spot isbetter    HPI:      Ms. Kaitlin Strickland is a 44 y.o. G1P1001 who LMP was Patient's last menstrual period was 06/07/2019 (approximate)., presents today for LT breast infection follow up from 2 wks ago. LT skin abscess treated with doxy and pt states that is better. Area actually drained after starting abx. No masses. Also had skin burn from warm compresses/heating pad, and pt treating with bacitracin daily and covering with gauze for about a wk. Then changed to neosporin for a few days and now using vaseline with gauze. The open burned area is decreasing in size and improving, but pt notes red, itchy area under gauzed area. Hx of allergic rxn in past after bilat breast surg.  Neg mammo 05/21/19; no FH breast/ovar cancer.  Pt also treated for BV with metrogel 12/20. Added probiotics and used repHresh with sx relief.   Patient Active Problem List   Diagnosis Date Noted  . Bacterial vaginosis 05/26/2019  . Hyperlipidemia 04/09/2019  . Annual physical exam 07/31/2018  . Anxiety 03/17/2018  . Vitamin D deficiency 03/17/2018  . Hypothyroidism 03/17/2018    Past Surgical History:  Procedure Laterality Date  . REDUCTION MAMMAPLASTY Bilateral 08/2015  . wisdom teeth removal      Family History  Problem Relation Age of Onset  . Hypertension Mother   . Arthritis Mother   . Depression Mother   . Hyperlipidemia Mother   . Miscarriages / [place] Mother   . Thyroid disease Mother   . Uterine cancer Mother        stage 101 dx'ed age 16 s/p hysterectomy/chemo; neg genetic testing  . Breast cancer Paternal Grandmother 34  . Cancer Paternal Grandmother        late 41s   . Diabetes type II Maternal Grandmother   . Arthritis Maternal Grandmother   . Depression Maternal Grandmother   . Diabetes Maternal Grandmother   . Hyperlipidemia Maternal Grandmother   . Hypertension Maternal Grandmother    . Intellectual disability Maternal Grandmother   . Arthritis Father   . Hypertension Father   . Hyperlipidemia Maternal Grandfather   . Cancer Maternal Grandfather        SCC mohs   . Diabetes Paternal Grandfather   . Heart disease Paternal Grandfather     Social History   Socioeconomic History  . Marital status: Divorced    Spouse name: Not on file  . Number of children: Not on file  . Years of education: Not on file  . Highest education level: Not on file  Occupational History  . Not on file  Tobacco Use  . Smoking status: Never Smoker  . Smokeless tobacco: Never Used  Substance and Sexual Activity  . Alcohol use: No  . Drug use: No  . Sexual activity: Yes    Birth control/protection: Pill  Other Topics Concern  . 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 Difficulty of Paying Living Expenses: Not on file  Food Insecurity:   . Worried About Charity fundraiser in the Last Year: Not on file  .  Ran Out of Food in the Last Year: Not on file  Transportation Needs:   . Lack of Transportation (Medical): Not on file  . Lack of Transportation (Non-Medical): Not on file  Physical Activity:   . Days of Exercise per Week: Not on file  . Minutes of Exercise per Session: Not on file  Stress:   . Feeling of Stress : Not on file  Social Connections:   . Frequency of Communication with Friends and Family: Not on file  . Frequency of Social Gatherings with Friends and Family: Not on file  . Attends Religious Services: Not on file  . Active Member of Clubs or Organizations: Not on file  . Attends Archivist Meetings: Not on file  . Marital Status: Not on file  Intimate Partner Violence:   . Fear of Current or Ex-Partner: Not on file  . Emotionally Abused: Not on file  .  Physically Abused: Not on file  . Sexually Abused: Not on file    Outpatient Medications Prior to Visit  Medication Sig Dispense Refill  . cholecalciferol (VITAMIN D) 1000 UNITS tablet Take 2 Units by mouth daily.    Marland Kitchen levonorgestrel-ethinyl estradiol (SEASONALE) 0.15-0.03 MG tablet Take 1 tablet by mouth daily. Use as directed 1 Package 3  . levothyroxine (SYNTHROID) 88 MCG tablet Take 1 tablet (88 mcg total) by mouth daily before breakfast. 30 minutes before breakfast 90 tablet 3  . sertraline (ZOLOFT) 50 MG tablet Take 1 tablet (50 mg total) by mouth daily. 90 tablet 3   No facility-administered medications prior to visit.      ROS:  Review of Systems  Constitutional: Negative for fever.  Gastrointestinal: Negative for blood in stool, constipation, diarrhea, nausea and vomiting.  Genitourinary: Negative for dyspareunia, dysuria, flank pain, frequency, hematuria, urgency, vaginal bleeding, vaginal discharge and vaginal pain.  Musculoskeletal: Negative for back pain.  Skin: Positive for rash and wound.   BREAST: itch/discoloration   OBJECTIVE:   Vitals:  BP 100/60   Ht 5\' 2"  (1.575 m)   Wt 180 lb (81.6 kg)   LMP 06/07/2019 (Approximate)   BMI 32.92 kg/m   Physical Exam Vitals reviewed.  Pulmonary:     Effort: Pulmonary effort is normal.  Chest:     Breasts: Breasts are symmetrical.        Right: No inverted nipple, mass, nipple discharge, skin change or tenderness.        Left: Skin change present. No swelling, inverted nipple, mass, nipple discharge or tenderness.    Musculoskeletal:        General: Normal range of motion.     Cervical back: Normal range of motion.  Skin:    General: Skin is warm and dry.  Neurological:     General: No focal deficit present.     Mental Status: She is alert and oriented to person, place, and time.     Cranial Nerves: No cranial nerve deficit.  Psychiatric:        Mood and Affect: Mood normal.        Behavior: Behavior  normal.        Thought Content: Thought content normal.        Judgment: Judgment normal.     Assessment/Plan: Breast abscess - Resolved after doxy use. Follow expectantly.  Skin burn--LT outer quadrant, most likely from heating pad. Area is healing/getting smaller. Go back to bacitracin oint (since not allergic to it) and use smaller gauze pad over lesion only.  Tinea corporis--looks like fungal rash surrounding skin burn due to moisture from gauze. Try lotrisone crm BID for a few days. Use smaller gauze pad to keep off of fungal area.  Pt to f/u by phone/MyChart if sx not improving by Thurs. If not, will refer to gen surg for further eval/mgmt.   Meds ordered this encounter  Medications  . clotrimazole-betamethasone (LOTRISONE) cream    Sig: Apply externally BID prn sx up to 2 wks    Dispense:  15 g    Refill:  0    Order Specific Question:   Supervising Provider    Answer:   Gae Dry [id number]      Return if symptoms worsen or fail to improve.  Alicia B. Copland, PA-C 07/19/2019 2:33 PM

## 2019-07-19 NOTE — Patient Instructions (Signed)
I value your feedback and entrusting us with your care. If you get a [facility] patient survey, I would appreciate you taking the time to let us know about your experience today. Thank you!  As of May 13, 2019, your lab results will be released to your MyChart immediately, before I even have a chance to see them. Please give me time to review them and contact you if there are any abnormalities. Thank you for your patience.  

## 2019-07-22 ENCOUNTER — Encounter: Payer: Self-pay | Admitting: Obstetrics and Gynecology

## 2019-07-22 DIAGNOSIS — S21002D Unspecified open wound of left breast, subsequent encounter: Secondary | ICD-10-CM

## 2019-07-22 DIAGNOSIS — R234 Changes in skin texture: Secondary | ICD-10-CM

## 2019-07-23 ENCOUNTER — Encounter: Payer: Self-pay | Admitting: Surgery

## 2019-07-23 ENCOUNTER — Ambulatory Visit: Payer: Federal, State, Local not specified - PPO | Admitting: Surgery

## 2019-07-23 ENCOUNTER — Other Ambulatory Visit: Payer: Self-pay

## 2019-07-23 VITALS — BP 165/113 | HR 98 | Temp 97.5°F | Resp 12 | Ht 62.0 in | Wt 179.8 lb

## 2019-07-23 DIAGNOSIS — B369 Superficial mycosis, unspecified: Secondary | ICD-10-CM | POA: Diagnosis not present

## 2019-07-23 DIAGNOSIS — T2121XA Burn of second degree of chest wall, initial encounter: Secondary | ICD-10-CM | POA: Diagnosis not present

## 2019-07-23 MED ORDER — SILVER SULFADIAZINE 1 % EX CREA: TOPICAL_CREAM | CUTANEOUS | 1 refills | Status: DC

## 2019-07-23 NOTE — Patient Instructions (Addendum)
Dr.Piscoya advised patient he would prescribed Silvadene Cream medication apply to the area twice daily to help with the burn area of the breast. Patient is to avoid heating pads, but may resume with heated compress. Patient recommended to use different gauzes to prevent breakouts. Patient was made aware that the breast may take a few weeks to completely heal. Patient will follow up in two weeks to check healing process.  Breast Self-Awareness Breast self-awareness is knowing how your breasts look and feel. Doing breast self-awareness is important. It allows you to catch a breast problem early while it is still small and can be treated. All women should do breast self-awareness, including women who have had breast implants. Tell your doctor if you notice a change in your breasts. What you need:  A mirror.  A well-lit room. How to do a breast self-exam A breast self-exam is one way to learn what is normal for your breasts and to check for changes. To do a breast self-exam: Look for changes  1. Take off all the clothes above your waist. 2. Stand in front of a mirror in a room with good lighting. 3. Put your hands on your hips. 4. Push your hands down. 5. Look at your breasts and nipples in the mirror to see if one breast or nipple looks different from the other. Check to see if: ? The shape of one breast is different. ? The size of one breast is different. ? There are wrinkles, dips, and bumps in one breast and not the other. 6. Look at each breast for changes in the skin, such as: ? Redness. ? Scaly areas. 7. Look for changes in your nipples, such as: ? Liquid around the nipples. ? Bleeding. ? Dimpling. ? Redness. ? A change in where the nipples are. Feel for changes  1. Lie on your back on the floor. 2. Feel each breast. To do this, follow these steps: ? Pick a breast to feel. ? Put the arm closest to that breast above your head. ? Use your other arm to feel the nipple area of  your breast. Feel the area with the pads of your three middle fingers by making small circles with your fingers. For the first circle, press lightly. For the second circle, press harder. For the third circle, press even harder. ? Keep making circles with your fingers at the different pressures as you move down your breast. Stop when you feel your ribs. ? Move your fingers a little toward the center of your body. ? Start making circles with your fingers again, this time going up until you reach your collarbone. ? Keep making up-and-down circles until you reach your armpit. Remember to keep using the three pressures. ? Feel the other breast in the same way. 3. Sit or stand in the tub or shower. 4. With soapy water on your skin, feel each breast the same way you did in step 2 when you were lying on the floor. Write down what you find Writing down what you find can help you remember what to tell your doctor. Write down:  What is normal for each breast.  Any changes you find in each breast, including: ? The kind of changes you find. ? Whether you have pain. ? Size and location of any lumps.  When you last had your menstrual period. General tips  Check your breasts every month.  If you are breastfeeding, the best time to check your breasts is after you feed  your baby or after you use a breast pump.  If you get menstrual periods, the best time to check your breasts is 5-7 days after your menstrual period is over.  With time, you will become comfortable with the self-exam, and you will begin to know if there are changes in your breasts. Contact a doctor if you:  See a change in the shape or size of your breasts or nipples.  See a change in the skin of your breast or nipples, such as red or scaly skin.  Have fluid coming from your nipples that is not normal.  Find a lump or thick area that was not there before.  Have pain in your breasts.  Have any concerns about your breast  health. Summary  Breast self-awareness includes looking for changes in your breasts, as well as feeling for changes within your breasts.  Breast self-awareness should be done in front of a mirror in a well-lit room.  You should check your breasts every month. If you get menstrual periods, the best time to check your breasts is 5-7 days after your menstrual period is over.  Let your doctor know of any changes you see in your breasts, including changes in size, changes on the skin, pain or tenderness, or fluid from your nipples that is not normal. This information is not intended to replace advice given to you by your health care provider. Make sure you discuss any questions you have with your health care provider. Document Revised: 01/06/2018 Document Reviewed: 01/06/2018 Elsevier Patient Education  [place].

## 2019-07-25 ENCOUNTER — Encounter: Payer: Self-pay | Admitting: Surgery
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
## 2019-08-06 ENCOUNTER — Encounter: Payer: Self-pay | Admitting: Surgery

## 2019-08-06 ENCOUNTER — Ambulatory Visit: Payer: Federal, State, Local not specified - PPO | Admitting: Surgery

## 2019-08-06 ENCOUNTER — Other Ambulatory Visit: Payer: Self-pay

## 2019-08-06 VITALS — BP 138/90 | HR 78 | Temp 97.7°F | Resp 13 | Ht 62.0 in | Wt 183.0 lb

## 2019-08-06 DIAGNOSIS — T2121XA Burn of second degree of chest wall, initial encounter: Secondary | ICD-10-CM | POA: Diagnosis not present
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
## 2019-08-06 NOTE — Patient Instructions (Signed)
Continue twice daily dressing changes until the area is no longer draining and has scabbed over.   Follow up here as needed. Call us if you have any problems or questions.

## 2019-10-01 ENCOUNTER — Other Ambulatory Visit: Payer: Self-pay | Admitting: Internal Medicine

## 2019-10-01 DIAGNOSIS — F419 Anxiety disorder, unspecified: Secondary | ICD-10-CM

## 2019-10-01 MED ORDER — SERTRALINE HCL 50 MG PO TABS: 50.0000 mg | ORAL_TABLET | Freq: Every day | ORAL | 2 refills | Status: DC

## 2019-10-12 ENCOUNTER — Other Ambulatory Visit: Payer: Self-pay

## 2019-10-14 ENCOUNTER — Ambulatory Visit: Payer: Federal, State, Local not specified - PPO | Admitting: Internal Medicine

## 2019-10-19 ENCOUNTER — Ambulatory Visit: Payer: Federal, State, Local not specified - PPO | Admitting: Internal Medicine

## 2019-10-19 ENCOUNTER — Encounter: Payer: Self-pay | Admitting: Internal Medicine

## 2019-10-19 ENCOUNTER — Other Ambulatory Visit: Payer: Self-pay

## 2019-10-19 VITALS — BP 130/70 | HR 79 | Temp 97.7°F | Ht 62.0 in | Wt 182.4 lb

## 2019-10-19 DIAGNOSIS — B353 Tinea pedis: Secondary | ICD-10-CM

## 2019-10-19 DIAGNOSIS — Z0000 Encounter for general adult medical examination without abnormal findings: Secondary | ICD-10-CM

## 2019-10-19 DIAGNOSIS — E039 Hypothyroidism, unspecified: Secondary | ICD-10-CM

## 2019-10-19 DIAGNOSIS — L301 Dyshidrosis [pompholyx]: Secondary | ICD-10-CM | POA: Diagnosis not present

## 2019-10-19 DIAGNOSIS — E785 Hyperlipidemia, unspecified: Secondary | ICD-10-CM

## 2019-10-19 DIAGNOSIS — L918 Other hypertrophic disorders of the skin: Secondary | ICD-10-CM

## 2019-10-19 DIAGNOSIS — Z1283 Encounter for screening for malignant neoplasm of skin: Secondary | ICD-10-CM

## 2019-10-19 DIAGNOSIS — Z1231 Encounter for screening mammogram for malignant neoplasm of breast: Secondary | ICD-10-CM | POA: Diagnosis not present

## 2019-10-19 DIAGNOSIS — Z1389 Encounter for screening for other disorder: Secondary | ICD-10-CM

## 2019-10-19 DIAGNOSIS — M722 Plantar fascial fibromatosis: Secondary | ICD-10-CM | POA: Insufficient documentation

## 2019-10-19 MED ORDER — BETAMETHASONE DIPROPIONATE 0.05 % EX CREA: TOPICAL_CREAM | Freq: Two times a day (BID) | CUTANEOUS | 0 refills | Status: DC | PRN

## 2019-10-19 MED ORDER — CLOTRIMAZOLE 1 % EX CREA: 1.0000 "application " | TOPICAL_CREAM | Freq: Two times a day (BID) | CUTANEOUS | 11 refills | Status: DC

## 2019-10-19 NOTE — Progress Notes (Signed)
Chief Complaint  Patient presents with  . Follow-up   Annual  1. Hypothyroidism on levo 88 mcg tsh normal 05/2019  Results for Kaitlin Strickland, Kaitlin Strickland (MRN [id number]) as of 10/19/2019 14:22  04/05/2019 07:58 TSH: 5.59 (H)  05/21/2019 08:40 TSH: 4.09 2. Plantar fascitis c/w right foot worse x 2 weeks worse after run recently tried cold water  Bottle and some home exercises   3. Skin tags right axilla, left thigh and wants tbse 4. C/o bottoms of feet peeling at times and no significant swelling except when running. No itching or pain  Review of Systems  Constitutional: Negative for weight loss.  HENT: Negative for hearing loss.   Eyes: Negative for blurred vision.  Respiratory: Negative for shortness of breath.   Cardiovascular: Negative for chest pain.  Gastrointestinal: Negative for abdominal pain.  Musculoskeletal: Positive for joint pain.  Skin: Positive for rash. Negative for itching.  Neurological: Negative for headaches.  Psychiatric/Behavioral: Negative for depression.   Past Medical History:  Diagnosis Date  . Anxiety   . Depression   . History of chicken pox   . Hypothyroid    h/o abnormal elevated tsh   . MVP (mitral valve prolapse) 2000   resolved 2005  . Ovarian cyst   . Vitamin D deficiency    Past Surgical History:  Procedure Laterality Date  . REDUCTION MAMMAPLASTY Bilateral 08/2015  . wisdom teeth removal     Family History  Problem Relation Age of Onset  . Hypertension Mother   . Arthritis Mother   . Depression Mother   . Hyperlipidemia Mother   . Miscarriages / [place] Mother   . Thyroid disease Mother   . Uterine cancer Mother        stage 77 dx'ed age 41 s/p hysterectomy/chemo; neg genetic testing  . Breast cancer Paternal Grandmother 87  . Cancer Paternal Grandmother        late 38s   . Diabetes type II Maternal Grandmother   . Arthritis Maternal Grandmother   . Depression Maternal Grandmother   . Diabetes Maternal Grandmother   .  Hyperlipidemia Maternal Grandmother   . Hypertension Maternal Grandmother   . Intellectual disability Maternal Grandmother   . Arthritis Father   . Hypertension Father   . Hyperlipidemia Maternal Grandfather   . Cancer Maternal Grandfather        SCC mohs   . Diabetes Paternal Grandfather   . Heart disease Paternal Grandfather    Social History   Socioeconomic History  . Marital status: Divorced    Spouse name: Not on file  . Number of children: Not on file  . Years of education: Not on file  . Highest education level: Not on file  Occupational History  . Not on file  Tobacco Use  . Smoking status: Never Smoker  . Smokeless tobacco: Never Used  Substance and Sexual Activity  . Alcohol use: No  . Drug use: No  . Sexual activity: Yes    Birth control/protection: Pill  Other Topics Concern  . 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 Difficulty of Paying Living Expenses:   Food Insecurity:   . Worried About Charity fundraiser  in the Last Year:   . [place] in the Last Year:   Transportation Needs:   . Film/video editor (Medical):   Marland Kitchen Lack of Transportation (Non-Medical):   Physical Activity:   . Days of Exercise per Week:   . Minutes of Exercise per Session:   Stress:   . Feeling of Stress :   Social Connections:   . Frequency of Communication with Friends and Family:   . Frequency of Social Gatherings with Friends and Family:   . Attends Religious Services:   . Active Member of Clubs or Organizations:   . Attends Archivist Meetings:   Marland Kitchen Marital Status:   Intimate Partner Violence:   . Fear of Current or Ex-Partner:   . Emotionally Abused:   Marland Kitchen Physically Abused:   . Sexually Abused:    Current Meds  Medication Sig  .  cholecalciferol (VITAMIN D) 1000 UNITS tablet Take 2 Units by mouth daily.  Marland Kitchen levonorgestrel-ethinyl estradiol (SEASONALE) 0.15-0.03 MG tablet Take 1 tablet by mouth daily. Use as directed  . levothyroxine (SYNTHROID) 88 MCG tablet Take 1 tablet (88 mcg total) by mouth daily before breakfast. 30 minutes before breakfast  . sertraline (ZOLOFT) 50 MG tablet Take 1 tablet (50 mg total) by mouth daily. appt further refills   Allergies  Allergen Reactions  . Penicillins Hives   No results found for this or any previous visit (from the past 2160 hour(s)). Objective  Body mass index is 33.36 kg/m. Wt Readings from Last 3 Encounters:  10/19/19 182 lb 6.4 oz (82.7 kg)  08/06/19 183 lb (83 kg)  07/23/19 179 lb 12.8 oz (81.6 kg)   Temp Readings from Last 3 Encounters:  10/19/19 97.7 F (36.5 C) (Temporal)  08/06/19 97.7 F (36.5 C)  07/23/19 (!) 97.5 F (36.4 C) (Temporal)   BP Readings from Last 3 Encounters:  10/19/19 130/70  08/06/19 138/90  07/23/19 (!) 165/113   Pulse Readings from Last 3 Encounters:  10/19/19 79  08/06/19 78  07/23/19 98    Physical Exam Vitals and nursing note reviewed.  Constitutional:      Appearance: Normal appearance. She is well-developed and well-groomed. She is obese.  HENT:     Head: Normocephalic and atraumatic.  Eyes:     Conjunctiva/sclera: Conjunctivae normal.     Pupils: Pupils are equal, round, and reactive to light.  Cardiovascular:     Rate and Rhythm: Normal rate and regular rhythm.     Heart sounds: Normal heart sounds. No murmur.  Pulmonary:     Effort: Pulmonary effort is normal.     Breath sounds: Normal breath sounds.  Musculoskeletal:       Legs:       Feet:  Skin:    General: Skin is warm and dry.  Neurological:     General: No focal deficit present.     Mental Status: She is alert and oriented to person, place, and time. Mental status is at baseline.     Gait: Gait normal.  Psychiatric:        Attention and  Perception: Attention and perception normal.        Mood and Affect: Mood and affect normal.        Speech: Speech normal.        Behavior: Behavior normal. Behavior is cooperative.        Thought Content: Thought content normal.        Cognition and Memory: Cognition and  memory normal.        Judgment: Judgment normal.     Assessment  Plan  Annual physical exam -  Flu shot had work [place] 02/26/19  covid 2/2 Tdap utd  Hep B new vx 2/2  MMR immune mammogram 05/15/18 negative sch 05/21/2019 neg -referred   Pap 05/18/18 neg HPV neg pap [facility] 05/24/19 FH uterine cancer mom  Labs [facility]/03/[facility] no need to see now Cont healthy diet and exercise  Tinea pedis of both feet - Plan: clotrimazole (LOTRIMIN) 1 % cream, betamethasone dipropionate 0.05 % cream  Dyshidrotic foot dermatitis - Plan: clotrimazole (LOTRIMIN) 1 % cream, betamethasone dipropionate 0.05 % cream  Cutaneous skin tags Skin cancer screening Referral to derm  Hypothyroidism, unspecified type - Plan: TSH levo 88 mcg Labs 04/04/20  Plantar fasciitis, right Given lists of stretches let me know if wants to see podiatry   Hyperlipidemia, unspecified hyperlipidemia type - Plan: Lipid panel rec healthy diet and exercise     Provider: Dr. Olivia Mackie McLean-Scocuzza-Internal Medicine

## 2019-10-19 NOTE — Patient Instructions (Addendum)
amlactin lotion for feet over the counter   Gold bond talc free powder   Fasting labs 04/04/20   Plantar Fasciitis Rehab Ask your health care provider which exercises are safe for you. Do exercises exactly as told by your health care provider and adjust them as directed. It is normal to feel mild stretching, pulling, tightness, or discomfort as you do these exercises. Stop right away if you feel sudden pain or your pain gets worse. Do not begin these exercises until told by your health care provider. Stretching and range-of-motion exercises These exercises warm up your muscles and joints and improve the movement and flexibility of your foot. These exercises also help to relieve pain. Plantar fascia stretch  1. Sit with your left / right leg crossed over your opposite knee. 2. Hold your heel with one hand with that thumb near your arch. With your other hand, hold your toes and gently pull them back toward the top of your foot. You should feel a stretch on the bottom of your toes or your foot (plantar fascia) or both. 3. Hold this stretch for__________ seconds. 4. Slowly release your toes and return to the starting position. Repeat __________ times. Complete this exercise __________ times a day. Gastrocnemius stretch, standing This exercise is also called a calf (gastroc) stretch. It stretches the muscles in the back of the upper calf. 1. Stand with your hands against a wall. 2. Extend your left / right leg behind you, and bend your front knee slightly. 3. Keeping your heels on the floor and your back knee straight, shift your weight toward the wall. Do not arch your back. You should feel a gentle stretch in your upper left / right calf. 4. Hold this position for __________ seconds. Repeat __________ times. Complete this exercise __________ times a day. Soleus stretch, standing This exercise is also called a calf (soleus) stretch. It stretches the muscles in the back of the lower  calf. 1. Stand with your hands against a wall. 2. Extend your left / right leg behind you, and bend your front knee slightly. 3. Keeping your heels on the floor, bend your back knee and shift your weight slightly over your back leg. You should feel a gentle stretch deep in your lower calf. 4. Hold this position for __________ seconds. Repeat __________ times. Complete this exercise __________ times a day. Gastroc and soleus stretch, standing step This exercise stretches the muscles in the back of the lower leg. These muscles are in the upper calf (gastrocnemius) and the lower calf (soleus). 1. Stand with the ball of your left / right foot on a step. The ball of your foot is on the walking surface, right under your toes. 2. Keep your other foot firmly on the same step. 3. Hold on to the wall or a railing for balance. 4. Slowly lift your other foot, allowing your body weight to press your left / right heel down over the edge of the step. You should feel a stretch in your left / right calf. 5. Hold this position for __________ seconds. 6. Return both feet to the step. 7. Repeat this exercise with a slight bend in your left / right knee. Repeat __________ times with your left / right knee straight and __________ times with your left / right knee bent. Complete this exercise __________ times a day. Balance exercise This exercise builds your balance and strength control of your arch to help take pressure off your plantar fascia. Single leg stand  If this exercise is too easy, you can try it with your eyes closed or while standing on a pillow. 1. Without shoes, stand near a railing or in a doorway. You may hold on to the railing or door frame as needed. 2. Stand on your left / right foot. Keep your big toe down on the floor and try to keep your arch lifted. Do not let your foot roll inward. 3. Hold this position for __________ seconds. Repeat __________ times. Complete this exercise __________ times a  day. This information is not intended to replace advice given to you by your health care provider. Make sure you discuss any questions you have with your health care provider. Document Revised: 09/10/2018 Document Reviewed: 03/18/2018 Elsevier Patient Education  [employer].  Plantar Fasciitis  Plantar fasciitis is a painful foot condition that affects the heel. It occurs when the band of tissue that connects the toes to the heel bone (plantar fascia) becomes irritated. This can happen as the result of exercising too much or doing other repetitive activities (overuse injury). The pain from plantar fasciitis can range from mild irritation to severe pain that makes it difficult to walk or move. The pain is usually worse in the morning after sleeping, or after sitting or lying down for a while. Pain may also be worse after long periods of walking or standing. What are the causes? This condition may be caused by:  Standing for long periods of time.  Wearing shoes that do not have good arch support.  Doing activities that put stress on joints (high-impact activities), including running, aerobics, and ballet.  Being overweight.  An abnormal way of walking (gait).  Tight muscles in the back of your lower leg (calf).  High arches in your feet.  Starting a new athletic activity. What are the signs or symptoms? The main symptom of this condition is heel pain. Pain may:  Be worse with first steps after a time of rest, especially in the morning after sleeping or after you have been sitting or lying down for a while.  Be worse after long periods of standing still.  Decrease after 30-45 minutes of activity, such as gentle walking. How is this diagnosed? This condition may be diagnosed based on your medical history and your symptoms. Your health care provider may ask questions about your activity level. Your health care provider will do a physical exam to check for:  A tender area on  the bottom of your foot.  A high arch in your foot.  Pain when you move your foot.  Difficulty moving your foot. You may have imaging tests to confirm the diagnosis, such as:  X-rays.  Ultrasound.  MRI. How is this treated? Treatment for plantar fasciitis depends on how severe your condition is. Treatment may include:  Rest, ice, applying pressure (compression), and raising the affected foot (elevation). This may be called RICE therapy. Your health care provider may recommend RICE therapy along with over-the-counter pain medicines to manage your pain.  Exercises to stretch your calves and your plantar fascia.  A splint that holds your foot in a stretched, upward position while you sleep (night splint).  Physical therapy to relieve symptoms and prevent problems in the future.  Injections of steroid medicine (cortisone) to relieve pain and inflammation.  Stimulating your plantar fascia with electrical impulses (extracorporeal shock wave therapy). This is usually the last treatment option before surgery.  Surgery, if other treatments have not worked after 12 months. Follow  these instructions at home:  Managing pain, stiffness, and swelling  If directed, put ice on the painful area: ? Put ice in a plastic bag, or use a frozen bottle of water. ? Place a towel between your skin and the bag or bottle. ? Roll the bottom of your foot over the bag or bottle. ? Do this for 20 minutes, 2-3 times a day.  Wear athletic shoes that have air-sole or gel-sole cushions, or try wearing soft shoe inserts that are designed for plantar fasciitis.  Raise (elevate) your foot above the level of your heart while you are sitting or lying down. Activity  Avoid activities that cause pain. Ask your health care provider what activities are safe for you.  Do physical therapy exercises and stretches as told by your health care provider.  Try activities and forms of exercise that are easier on your  joints (low-impact). Examples include swimming, water aerobics, and biking. General instructions  Take over-the-counter and prescription medicines only as told by your health care provider.  Wear a night splint while sleeping, if told by your health care provider. Loosen the splint if your toes tingle, become numb, or turn cold and blue.  Maintain a healthy weight, or work with your health care provider to lose weight as needed.  Keep all follow-up visits as told by your health care provider. This is important. Contact a health care provider if you:  Have symptoms that do not go away after caring for yourself at home.  Have pain that gets worse.  Have pain that affects your ability to move or do your daily activities. Summary  Plantar fasciitis is a painful foot condition that affects the heel. It occurs when the band of tissue that connects the toes to the heel bone (plantar fascia) becomes irritated.  The main symptom of this condition is heel pain that may be worse after exercising too much or standing still for a long time.  Treatment varies, but it usually starts with rest, ice, compression, and elevation (RICE therapy) and over-the-counter medicines to manage pain. This information is not intended to replace advice given to you by your health care provider. Make sure you discuss any questions you have with your health care provider. Document Revised: 05/02/2017 Document Reviewed: 03/17/2017 Elsevier Patient Education  2020 [employer].

## 2019-11-12 ENCOUNTER — Emergency Department
Admission: EM | Admit: 2019-11-12 | Discharge: 2019-11-12 | Disposition: A | Discharge status: Home / Self Care | Payer: Federal, State, Local not specified - PPO | Attending: Student | Admitting: Student

## 2019-11-12 ENCOUNTER — Emergency Department: Payer: Federal, State, Local not specified - PPO

## 2019-11-12 ENCOUNTER — Other Ambulatory Visit: Payer: Self-pay

## 2019-11-12 DIAGNOSIS — E039 Hypothyroidism, unspecified: Secondary | ICD-10-CM | POA: Diagnosis not present

## 2019-11-12 DIAGNOSIS — Z793 Long term (current) use of hormonal contraceptives: Secondary | ICD-10-CM | POA: Diagnosis not present

## 2019-11-12 DIAGNOSIS — R519 Headache, unspecified: Secondary | ICD-10-CM

## 2019-11-12 DIAGNOSIS — R030 Elevated blood-pressure reading, without diagnosis of hypertension: Secondary | ICD-10-CM | POA: Insufficient documentation

## 2019-11-12 DIAGNOSIS — I10 Essential (primary) hypertension: Secondary | ICD-10-CM | POA: Diagnosis not present

## 2019-11-12 NOTE — Discharge Instructions (Addendum)
Your exam and head CT are normal at this time.  Blood pressure is elevated but not consistent with a hypertensive emergency.  Continue to monitor symptoms and take over-the-counter Tylenol to the provided pain relief.  Follow-up with your primary provider for ongoing symptom and treat.  Return to the ED as needed.  Monitor blood pressure weekly, and track accordingly.

## 2019-11-12 NOTE — ED Provider Notes (Signed)
[facility] Emergency Department Provider Note ____________________________________________  Time seen: 1230  I have reviewed the triage vital signs and the nursing notes.  HISTORY  Chief Complaint  Headache  HPI Kaitlin Strickland is a 44 y.o. female presents with left-sided headache, with onset while asleep last night. She admits to car travel, for vacation last week.  She gives remote history of being on metoprolol some 15+ years ago for PVCs.  She denies any history of chronic ongoing high blood pressure.  She denies any trauma, fall, weakness, syncope, paralysis, visual disturbance, or vertigo.  She describes a headache is responded somewhat to over-the-counter ibuprofen.  She reports normal menses, every 3 months on birth control pill.  Past Medical History:  Diagnosis Date   Anxiety    Depression    History of chicken pox    Hypothyroid    h/o abnormal elevated tsh    MVP (mitral valve prolapse) 2000   resolved 2005   Ovarian cyst    Vitamin D deficiency     Patient Active Problem List   Diagnosis Date Noted   Plantar fasciitis, right 10/19/2019   Bacterial vaginosis 05/26/2019   Hyperlipidemia 04/09/2019   Annual physical exam 07/31/2018   Anxiety 03/17/2018   Vitamin D deficiency 03/17/2018   Hypothyroidism 03/17/2018    Past Surgical History:  Procedure Laterality Date   REDUCTION MAMMAPLASTY Bilateral 08/2015   wisdom teeth removal      Prior to Admission medications   Medication Sig Start Date End Date Taking? Authorizing Provider  betamethasone dipropionate 0.05 % cream Apply topically 2 (two) times daily as needed. 10/19/19   McLean-Scocuzza, Nino Glow, MD  cholecalciferol (VITAMIN D) 1000 UNITS tablet Take 2 Units by mouth daily.    [provider]  clotrimazole (LOTRIMIN) 1 % cream Apply 1 application topically 2 (two) times daily. heels 10/19/19   McLean-Scocuzza, Nino Glow, MD  clotrimazole-betamethasone (LOTRISONE)  cream Apply externally BID prn sx up to 2 wks Patient not taking: Reported on 10/19/2019 5/99/35   Copland, Deirdre Evener, PA-C  levonorgestrel-ethinyl estradiol (SEASONALE) 0.15-0.03 MG tablet Take 1 tablet by mouth daily. Use as directed 70/17/79   Copland, Deirdre Evener, PA-C  levothyroxine (SYNTHROID) 88 MCG tablet Take 1 tablet (88 mcg total) by mouth daily before breakfast. 30 minutes before breakfast 04/06/19   McLean-Scocuzza, Nino Glow, MD  sertraline (ZOLOFT) 50 MG tablet Take 1 tablet (50 mg total) by mouth daily. appt further refills 10/01/19   McLean-Scocuzza, Nino Glow, MD    Allergies Penicillins  Family History  Problem Relation Age of Onset   Hypertension Mother    Arthritis Mother    Depression Mother    Hyperlipidemia Mother    Miscarriages / [place] Mother    Thyroid disease Mother    Uterine cancer Mother        stage 51 dx'ed age 45 s/p hysterectomy/chemo; neg genetic testing   Breast cancer Paternal Grandmother 12   Cancer Paternal Grandmother        late 33s    Diabetes type II Maternal Grandmother    Arthritis Maternal Grandmother    Depression Maternal Grandmother    Diabetes Maternal Grandmother    Hyperlipidemia Maternal Grandmother    Hypertension Maternal Grandmother    Intellectual disability Maternal Grandmother    Arthritis Father    Hypertension Father    Hyperlipidemia Maternal Grandfather    Cancer Maternal Grandfather        SCC mohs    Diabetes  Paternal Grandfather    Heart disease Paternal Grandfather     Social History Social History   Tobacco Use   Smoking status: Never Smoker   Smokeless tobacco: Never Used  Scientific laboratory technician Use: Never used  Substance Use Topics   Alcohol use: No   Drug use: No    Review of Systems  Constitutional: Negative for fever. Eyes: Negative for visual changes. ENT: Negative for sore throat. Cardiovascular: Negative for chest pain. Respiratory: Negative for shortness of  breath. Gastrointestinal: Negative for abdominal pain, vomiting and diarrhea. Genitourinary: Negative for dysuria. Denies breakthrough menstrual bleeding.  Musculoskeletal: Negative for back pain. Skin: Negative for rash. Neurological: Negative for headaches. Denies focal weakness or numbness. ____________________________________________  PHYSICAL EXAM:  VITAL SIGNS: ED Triage Vitals  Enc Vitals Group     BP 11/12/19 1203 (!) 172/95     Pulse Rate 11/12/19 1203 81     Resp 11/12/19 1203 18     Temp 11/12/19 1203 98 F (36.7 C)     Temp src --      SpO2 11/12/19 1203 97 %     Weight 11/12/19 1201 150 lb (68 kg)     Height 11/12/19 1201 5\' 4"  (1.626 m)     Head Circumference --      Peak Flow --      Pain Score 11/12/19 1201 7     Pain Loc --      Pain Edu? --      Excl. in [place]? --     Constitutional: Alert and oriented. Well appearing and in no distress. GCS = 15 Head: Normocephalic and atraumatic. Eyes: Conjunctivae are normal. PERRL. Normal extraocular movements and fundi bilaterally. Ears: Canals clear. TMs intact bilaterally. Nose: No congestion/rhinorrhea/epistaxis. Mouth/Throat: Mucous membranes are moist. Neck: Supple. No thyromegaly.  Normal range of motion without crepitus.  No trigeminal tenderness noted.  No nuchal rigidity appreciated. Cardiovascular: Normal rate, regular rhythm. Normal distal pulses. Respiratory: Normal respiratory effort. No wheezes/rales/rhonchi. Gastrointestinal: Soft and nontender. No distention. Musculoskeletal: Nontender with normal range of motion in all extremities.  Neurologic: Cranial nerves II through XII grossly intact.  Normal gait without ataxia. Normal speech and language. No gross focal neurologic deficits are appreciated. Skin:  Skin is warm, dry and intact. No rash noted. Psychiatric: Mood and affect are normal. Patient exhibits appropriate insight and judgment. ____________________________________________   RADIOLOGY  CT  Head w/o CM Negative  ____________________________________________  PROCEDURES  Procedures ____________________________________________  INITIAL IMPRESSION / ASSESSMENT AND PLAN / ED COURSE  Patient with ED evaluation of sudden left-sided headache with onset last night.  Patient was also concerned that she was found to have elevated blood pressures.  She is here today with normal exam otherwise and noting some improvement of her headache pain.  She presents for evaluation of her symptoms.  Blood pressure is slightly elevated on exam.  Otherwise exam is benign without signs of acute neuromuscular deficit or signs of intracranial process.  CT is reassuring at this time.  Patient is advised to follow-up with her primary provider for ongoing symptoms.  She will monitor blood pressures in the interim and follow-up as appropriate.  Return precautions have been reviewed.  Kaitlin Strickland was evaluated in Emergency Department on 11/12/2019 for the symptoms described in the history of present illness. She was evaluated in the context of the global COVID-19 pandemic, which necessitated consideration that the patient might be at risk for infection with the SARS-CoV-2 virus that causes  COVID-19. Institutional protocols and algorithms that pertain to the evaluation of patients at risk for COVID-19 are in a state of rapid change based on information released by regulatory bodies including the CDC and federal and state organizations. These policies and algorithms were followed during the patient's care in the ED. ____________________________________________  FINAL CLINICAL IMPRESSION(S) / ED DIAGNOSES  Final diagnoses:  Acute nonintractable headache, unspecified headache type  Elevated BP without diagnosis of hypertension      Letta Cargile, Dannielle Karvonen, PA-C 11/12/19 1357    Lilia Pro., MD 11/12/19 1427

## 2019-11-12 NOTE — ED Triage Notes (Addendum)
Pt reports HTN and headache that started this am. Pt denies any SOB, blurry vision, dizziness or any other complaints.  Pt states this has happened before and medication helped

## 2019-11-12 NOTE — ED Notes (Signed)
See triage note  Presents with headache  States she woke up with h/a this am  And she noticed that her b/p was elevated   No hx of HTN

## 2019-11-16 ENCOUNTER — Other Ambulatory Visit: Payer: Self-pay

## 2019-11-16 ENCOUNTER — Ambulatory Visit: Payer: Federal, State, Local not specified - PPO | Admitting: Internal Medicine

## 2019-11-16 ENCOUNTER — Encounter: Payer: Self-pay | Admitting: Internal Medicine

## 2019-11-16 VITALS — BP 140/88 | HR 78 | Temp 97.4°F | Ht 64.0 in | Wt 184.0 lb

## 2019-11-16 DIAGNOSIS — M722 Plantar fascial fibromatosis: Secondary | ICD-10-CM | POA: Diagnosis not present

## 2019-11-16 DIAGNOSIS — E785 Hyperlipidemia, unspecified: Secondary | ICD-10-CM | POA: Diagnosis not present

## 2019-11-16 DIAGNOSIS — E039 Hypothyroidism, unspecified: Secondary | ICD-10-CM

## 2019-11-16 DIAGNOSIS — I10 Essential (primary) hypertension: Secondary | ICD-10-CM | POA: Diagnosis not present

## 2019-11-16 DIAGNOSIS — Z1389 Encounter for screening for other disorder: Secondary | ICD-10-CM

## 2019-11-16 DIAGNOSIS — E669 Obesity, unspecified: Secondary | ICD-10-CM

## 2019-11-16 LAB — COMPREHENSIVE METABOLIC PANEL
ALT: 16 U/L (ref 0–35)
AST: 21 U/L (ref 0–37)
Albumin: 4.3 g/dL (ref 3.5–5.2)
Alkaline Phosphatase: 53 U/L (ref 39–117)
BUN: 15 mg/dL (ref 6–23)
CO2: 27 mEq/L (ref 19–32)
Calcium: 9.5 mg/dL (ref 8.4–10.5)
Chloride: 101 mEq/L (ref 96–112)
Creatinine, Ser: 0.82 mg/dL (ref 0.40–1.20)
GFR: 76.65 mL/min (ref >60.00)
Glucose, Bld: 82 mg/dL (ref 70–99)
Potassium: 4.6 mEq/L (ref 3.5–5.1)
Sodium: 135 mEq/L (ref 135–145)
Total Bilirubin: 0.7 mg/dL (ref 0.2–1.2)
Total Protein: 7.2 g/dL (ref 6.0–8.3)

## 2019-11-16 LAB — LIPID PANEL
Cholesterol: 243 mg/dL — ABNORMAL HIGH (ref 0–200)
HDL: 62.4 mg/dL (ref >39.00)
LDL Cholesterol: 144 mg/dL — ABNORMAL HIGH (ref 0–99)
NonHDL: 180.56
Total CHOL/HDL Ratio: 4
Triglycerides: 183 mg/dL — ABNORMAL HIGH (ref 0.0–149.0)
VLDL: 36.6 mg/dL (ref 0.0–40.0)

## 2019-11-16 LAB — TSH: TSH: 3.87 u[IU]/mL (ref 0.35–4.50)

## 2019-11-16 MED ORDER — AMLODIPINE BESYLATE 2.5 MG PO TABS: 2.5000 mg | ORAL_TABLET | Freq: Every day | ORAL | 0 refills | Status: DC

## 2019-11-16 NOTE — Progress Notes (Signed)
Chief Complaint  Patient presents with  . Hospitalization Follow-up  . Hypertension   ED f/u  1. htn BP was 172/95 11/12/19 and pt had left sided h/a she reports diet has not been the best since went to [facility]. She has dull frontal h/a today no h/o migraines 2. HLD 3. Hypothyroidism on levo 88 will check labs today  4. Right plantar fascitis better with ice and stretches   Review of Systems  Constitutional: Negative for weight loss.  HENT: Negative for hearing loss.   Eyes: Negative for blurred vision.  Respiratory: Negative for shortness of breath.   Cardiovascular: Negative for chest pain.  Gastrointestinal: Negative for abdominal pain.  Musculoskeletal: Negative for joint pain.  Skin: Negative for rash.  Neurological: Positive for headaches.   Past Medical History:  Diagnosis Date  . Anxiety   . Depression   . History of chicken pox   . Hypothyroid    h/o abnormal elevated tsh   . MVP (mitral valve prolapse) 2000   resolved 2005  . Ovarian cyst   . Vitamin D deficiency    Past Surgical History:  Procedure Laterality Date  . REDUCTION MAMMAPLASTY Bilateral 08/2015  . wisdom teeth removal     Family History  Problem Relation Age of Onset  . Hypertension Mother   . Arthritis Mother   . Depression Mother   . Hyperlipidemia Mother   . Miscarriages / [place] Mother   . Thyroid disease Mother   . Uterine cancer Mother        stage 106 dx'ed age 3 s/p hysterectomy/chemo; neg genetic testing  . Breast cancer Paternal Grandmother 76  . Cancer Paternal Grandmother        late 100s   . Diabetes type II Maternal Grandmother   . Arthritis Maternal Grandmother   . Depression Maternal Grandmother   . Diabetes Maternal Grandmother   . Hyperlipidemia Maternal Grandmother   . Hypertension Maternal Grandmother   . Intellectual disability Maternal Grandmother   . Arthritis Father   . Hypertension Father   . Hyperlipidemia Maternal Grandfather   . Cancer Maternal Grandfather         SCC mohs   . Diabetes Paternal Grandfather   . Heart disease Paternal Grandfather    Social History   Socioeconomic History  . Marital status: Divorced    Spouse name: Not on file  . Number of children: Not on file  . Years of education: Not on file  . Highest education level: Not on file  Occupational History  . Not on file  Tobacco Use  . Smoking status: Never Smoker  . Smokeless tobacco: Never Used  Vaping Use  . Vaping Use: Never used  Substance and Sexual Activity  . Alcohol use: No  . Drug use: No  . Sexual activity: Yes    Birth control/protection: Pill  Other Topics Concern  . 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 Difficulty of Paying Living Expenses:   Food Insecurity:   . Worried About Charity fundraiser in the Last Year:   . [place] in the Last Year:   Transportation Needs:   . Lack of  Transportation (Medical):   Marland Kitchen Lack of Transportation (Non-Medical):   Physical Activity:   . Days of Exercise per Week:   . Minutes of Exercise per Session:   Stress:   . Feeling of Stress :   Social Connections:   . Frequency of Communication with Friends and Family:   . Frequency of Social Gatherings with Friends and Family:   . Attends Religious Services:   . Active Member of Clubs or Organizations:   . Attends Archivist Meetings:   Marland Kitchen Marital Status:   Intimate Partner Violence:   . Fear of Current or Ex-Partner:   . Emotionally Abused:   Marland Kitchen Physically Abused:   . Sexually Abused:    Current Meds  Medication Sig  . betamethasone dipropionate 0.05 % cream Apply topically 2 (two) times daily as needed.  . cholecalciferol (VITAMIN D) 1000 UNITS tablet Take 2 Units by mouth daily.  . clotrimazole (LOTRIMIN) 1 % cream Apply 1  application topically 2 (two) times daily. heels  . levonorgestrel-ethinyl estradiol (SEASONALE) 0.15-0.03 MG tablet Take 1 tablet by mouth daily. Use as directed  . levothyroxine (SYNTHROID) 88 MCG tablet Take 1 tablet (88 mcg total) by mouth daily before breakfast. 30 minutes before breakfast  . sertraline (ZOLOFT) 50 MG tablet Take 1 tablet (50 mg total) by mouth daily. appt further refills  . [DISCONTINUED] clotrimazole-betamethasone (LOTRISONE) cream Apply externally BID prn sx up to 2 wks   Allergies  Allergen Reactions  . Penicillins Hives   No results found for this or any previous visit (from the past 2160 hour(s)). Objective  Body mass index is 31.58 kg/m. Wt Readings from Last 3 Encounters:  11/16/19 184 lb (83.5 kg)  11/12/19 150 lb (68 kg)  10/19/19 182 lb 6.4 oz (82.7 kg)   Temp Readings from Last 3 Encounters:  11/16/19 (!) 97.4 F (36.3 C) (Temporal)  11/12/19 98 F (36.7 C)  10/19/19 97.7 F (36.5 C) (Temporal)   BP Readings from Last 3 Encounters:  11/16/19 140/88  11/12/19 (!) 172/95  10/19/19 130/70   Pulse Readings from Last 3 Encounters:  11/16/19 78  11/12/19 81  10/19/19 79    Physical Exam Vitals and nursing note reviewed.  Constitutional:      Appearance: Normal appearance. She is well-developed and well-groomed. She is obese.  HENT:     Head: Normocephalic and atraumatic.  Eyes:     Conjunctiva/sclera: Conjunctivae normal.     Pupils: Pupils are equal, round, and reactive to light.  Cardiovascular:     Rate and Rhythm: Normal rate and regular rhythm.     Heart sounds: Normal heart sounds. No murmur heard.   Pulmonary:     Effort: Pulmonary effort is normal.     Breath sounds: Normal breath sounds.  Skin:    General: Skin is warm and dry.  Neurological:     General: No focal deficit present.     Mental Status: She is alert and oriented to person, place, and time. Mental status is at baseline.     Gait: Gait normal.  Psychiatric:         Attention and Perception: Attention and perception normal.        Mood and Affect: Mood and affect normal.        Speech: Speech normal.        Behavior: Behavior normal. Behavior is cooperative.        Thought Content: Thought content normal.  Cognition and Memory: Cognition and memory normal.        Judgment: Judgment normal.     Assessment  Plan  Essential hypertension - Plan: amLODipine (NORVASC) 2.5 MG tablet if BP still not <130/<80 in 1 week increase to 5 mg and let me know, Comprehensive metabolic panel, Lipid panel She was pm dilt in the past for MVP which she reports after f/u echo negative for MVP  Hyperlipidemia/obesity - Plan: Lipid panel Healthy diet and exercise   Hypothyroidism, unspecified type - Plan: TSH Cont meds  Plantar fasciitis, right Resolved for now no need for echo  HM Flu shot hadwork [place] 02/26/19 covid 2/2 Tdaputd Hep B new vx 2/2 immune MMR immune mammogram 05/15/18 negative, 12/18/2020neg order in   Colonoscopy age 89   Pap 05/18/18 neg HPV neg pap [facility]/20 FH uterine cancer mom Labs 04/03/2020 [facility] appt 02/2020  Cont healthy diet and exercise  Provider: Dr. Olivia Mackie McLean-Scocuzza-Internal Medicine

## 2019-11-16 NOTE — Patient Instructions (Addendum)
Goal <130/<80  My chart me 11/29/19 about your readings   Hypertension, Adult High blood pressure (hypertension) is when the force of blood pumping through the arteries is too strong. The arteries are the blood vessels that carry blood from the heart throughout the body. Hypertension forces the heart to work harder to pump blood and may cause arteries to become narrow or stiff. Untreated or uncontrolled hypertension can cause a heart attack, heart failure, a stroke, kidney disease, and other problems. A blood pressure reading consists of a higher number over a lower number. Ideally, your blood pressure should be below 120/80. The first ("top") number is called the systolic pressure. It is a measure of the pressure in your arteries as your heart beats. The second ("bottom") number is called the diastolic pressure. It is a measure of the pressure in your arteries as the heart relaxes. What are the causes? The exact cause of this condition is not known. There are some conditions that result in or are related to high blood pressure. What increases the risk? Some risk factors for high blood pressure are under your control. The following factors may make you more likely to develop this condition:  Smoking.  Having type 2 diabetes mellitus, high cholesterol, or both.  Not getting enough exercise or physical activity.  Being overweight.  Having too much fat, sugar, calories, or salt (sodium) in your diet.  Drinking too much alcohol. Some risk factors for high blood pressure may be difficult or impossible to change. Some of these factors include:  Having chronic kidney disease.  Having a family history of high blood pressure.  Age. Risk increases with age.  Race. You may be at higher risk if you are African American.  Gender. Men are at higher risk than women before age 8. After age 53, women are at higher risk than men.  Having obstructive sleep apnea.  Stress. What are the signs or  symptoms? High blood pressure may not cause symptoms. Very high blood pressure (hypertensive crisis) may cause:  Headache.  Anxiety.  Shortness of breath.  Nosebleed.  Nausea and vomiting.  Vision changes.  Severe chest pain.  Seizures. How is this diagnosed? This condition is diagnosed by measuring your blood pressure while you are seated, with your arm resting on a flat surface, your legs uncrossed, and your feet flat on the floor. The cuff of the blood pressure monitor will be placed directly against the skin of your upper arm at the level of your heart. It should be measured at least twice using the same arm. Certain conditions can cause a difference in blood pressure between your right and left arms. Certain factors can cause blood pressure readings to be lower or higher than normal for a short period of time:  When your blood pressure is higher when you are in a health care provider's office than when you are at home, this is called white coat hypertension. Most people with this condition do not need medicines.  When your blood pressure is higher at home than when you are in a health care provider's office, this is called masked hypertension. Most people with this condition may need medicines to control blood pressure. If you have a high blood pressure reading during one visit or you have normal blood pressure with other risk factors, you may be asked to:  Return on a different day to have your blood pressure checked again.  Monitor your blood pressure at home for 1 week or longer.  If you are diagnosed with hypertension, you may have other blood or imaging tests to help your health care provider understand your overall risk for other conditions. How is this treated? This condition is treated by making healthy lifestyle changes, such as eating healthy foods, exercising more, and reducing your alcohol intake. Your health care provider may prescribe medicine if lifestyle changes  are not enough to get your blood pressure under control, and if:  Your systolic blood pressure is above 130.  Your diastolic blood pressure is above 80. Your personal target blood pressure may vary depending on your medical conditions, your age, and other factors. Follow these instructions at home: Eating and drinking   Eat a diet that is high in fiber and potassium, and low in sodium, added sugar, and fat. An example eating plan is called the DASH (Dietary Approaches to Stop Hypertension) diet. To eat this way: ? Eat plenty of fresh fruits and vegetables. Try to fill one half of your plate at each meal with fruits and vegetables. ? Eat whole grains, such as whole-wheat pasta, brown rice, or whole-grain bread. Fill about one fourth of your plate with whole grains. ? Eat or drink low-fat dairy products, such as skim milk or low-fat yogurt. ? Avoid fatty cuts of meat, processed or cured meats, and poultry with skin. Fill about one fourth of your plate with lean proteins, such as fish, chicken without skin, beans, eggs, or tofu. ? Avoid pre-made and processed foods. These tend to be higher in sodium, added sugar, and fat.  Reduce your daily sodium intake. Most people with hypertension should eat less than 1,500 mg of sodium a day.  Do not drink alcohol if: ? Your health care provider tells you not to drink. ? You are pregnant, may be pregnant, or are planning to become pregnant.  If you drink alcohol: ? Limit how much you use to:  0-1 drink a day for women.  0-2 drinks a day for men. ? Be aware of how much alcohol is in your drink. In the [place] one drink equals one 12 oz bottle of beer (355 mL), one 5 oz glass of wine (148 mL), or one 1 oz glass of hard liquor (44 mL). Lifestyle   Work with your health care provider to maintain a healthy body weight or to lose weight. Ask what an ideal weight is for you.  Get at least 30 minutes of exercise most days of the week. Activities may  include walking, swimming, or biking.  Include exercise to strengthen your muscles (resistance exercise), such as Pilates or lifting weights, as part of your weekly exercise routine. Try to do these types of exercises for 30 minutes at least 3 days a week.  Do not use any products that contain nicotine or tobacco, such as cigarettes, e-cigarettes, and chewing tobacco. If you need help quitting, ask your health care provider.  Monitor your blood pressure at home as told by your health care provider.  Keep all follow-up visits as told by your health care provider. This is important. Medicines  Take over-the-counter and prescription medicines only as told by your health care provider. Follow directions carefully. Blood pressure medicines must be taken as prescribed.  Do not skip doses of blood pressure medicine. Doing this puts you at risk for problems and can make the medicine less effective.  Ask your health care provider about side effects or reactions to medicines that you should watch for. Contact a health care provider if  you:  Think you are having a reaction to a medicine you are taking.  Have headaches that keep coming back (recurring).  Feel dizzy.  Have swelling in your ankles.  Have trouble with your vision. Get help right away if you:  Develop a severe headache or confusion.  Have unusual weakness or numbness.  Feel faint.  Have severe pain in your chest or abdomen.  Vomit repeatedly.  Have trouble breathing. Summary  Hypertension is when the force of blood pumping through your arteries is too strong. If this condition is not controlled, it may put you at risk for serious complications.  Your personal target blood pressure may vary depending on your medical conditions, your age, and other factors. For most people, a normal blood pressure is less than 120/80.  Hypertension is treated with lifestyle changes, medicines, or a combination of both. Lifestyle changes  include losing weight, eating a healthy, low-sodium diet, exercising more, and limiting alcohol. This information is not intended to replace advice given to you by your health care provider. Make sure you discuss any questions you have with your health care provider. Document Revised: 01/28/2018 Document Reviewed: 01/28/2018 Elsevier Patient Education  [place] DASH stands for "Dietary Approaches to Stop Hypertension." The DASH eating plan is a healthy eating plan that has been shown to reduce high blood pressure (hypertension). It may also reduce your risk for type 2 diabetes, heart disease, and stroke. The DASH eating plan may also help with weight loss. What are tips for following this plan?  General guidelines  Avoid eating more than 2,300 mg (milligrams) of salt (sodium) a day. If you have hypertension, you may need to reduce your sodium intake to 1,500 mg a day.  Limit alcohol intake to no more than 1 drink a day for nonpregnant women and 2 drinks a day for men. One drink equals 12 oz of beer, 5 oz of wine, or 1 oz of hard liquor.  Work with your health care provider to maintain a healthy body weight or to lose weight. Ask what an ideal weight is for you.  Get at least 30 minutes of exercise that causes your heart to beat faster (aerobic exercise) most days of the week. Activities may include walking, swimming, or biking.  Work with your health care provider or diet and nutrition specialist (dietitian) to adjust your eating plan to your individual calorie needs. Reading food labels   Check food labels for the amount of sodium per serving. Choose foods with less than 5 percent of the Daily Value of sodium. Generally, foods with less than 300 mg of sodium per serving fit into this eating plan.  To find whole grains, look for the word "whole" as the first word in the ingredient list. Shopping  Buy products labeled as "low-sodium" or "no salt added."  Buy  fresh foods. Avoid canned foods and premade or frozen meals. Cooking  Avoid adding salt when cooking. Use salt-free seasonings or herbs instead of table salt or sea salt. Check with your health care provider or pharmacist before using salt substitutes.  Do not fry foods. Cook foods using healthy methods such as baking, boiling, grilling, and broiling instead.  Cook with heart-healthy oils, such as olive, canola, soybean, or sunflower oil. Meal planning  Eat a balanced diet that includes: ? 5 or more servings of fruits and vegetables each day. At each meal, try to fill half of your plate with fruits and vegetables. ?  Up to 6-8 servings of whole grains each day. ? Less than 6 oz of lean meat, poultry, or fish each day. A 3-oz serving of meat is about the same size as a deck of cards. One egg equals 1 oz. ? 2 servings of low-fat dairy each day. ? A serving of nuts, seeds, or beans 5 times each week. ? Heart-healthy fats. Healthy fats called Omega-3 fatty acids are found in foods such as flaxseeds and coldwater fish, like sardines, salmon, and mackerel.  Limit how much you eat of the following: ? Canned or prepackaged foods. ? Food that is high in trans fat, such as fried foods. ? Food that is high in saturated fat, such as fatty meat. ? Sweets, desserts, sugary drinks, and other foods with added sugar. ? Full-fat dairy products.  Do not salt foods before eating.  Try to eat at least 2 vegetarian meals each week.  Eat more home-cooked food and less restaurant, buffet, and fast food.  When eating at a restaurant, ask that your food be prepared with less salt or no salt, if possible. What foods are recommended? The items listed may not be a complete list. Talk with your dietitian about what dietary choices are best for you. Grains Whole-grain or whole-wheat bread. Whole-grain or whole-wheat pasta. Brown rice. [place]. Bulgur. Whole-grain and low-sodium cereals. Pita bread.  Low-fat, low-sodium crackers. Whole-wheat flour tortillas. Vegetables Fresh or frozen vegetables (raw, steamed, roasted, or grilled). Low-sodium or reduced-sodium tomato and vegetable juice. Low-sodium or reduced-sodium tomato sauce and tomato paste. Low-sodium or reduced-sodium canned vegetables. Fruits All fresh, dried, or frozen fruit. Canned fruit in natural juice (without added sugar). Meat and other protein foods Skinless chicken or [place]. Ground chicken or [place]. Pork with fat trimmed off. Fish and seafood. Egg whites. Dried beans, peas, or lentils. Unsalted nuts, nut butters, and seeds. Unsalted canned beans. Lean cuts of beef with fat trimmed off. Low-sodium, lean deli meat. Dairy Low-fat (1%) or fat-free (skim) milk. Fat-free, low-fat, or reduced-fat cheeses. Nonfat, low-sodium ricotta or cottage cheese. Low-fat or nonfat yogurt. Low-fat, low-sodium cheese. Fats and oils Soft margarine without trans fats. Vegetable oil. Low-fat, reduced-fat, or light mayonnaise and salad dressings (reduced-sodium). Canola, safflower, olive, soybean, and sunflower oils. Avocado. Seasoning and other foods Herbs. Spices. Seasoning mixes without salt. Unsalted popcorn and pretzels. Fat-free sweets. What foods are not recommended? The items listed may not be a complete list. Talk with your dietitian about what dietary choices are best for you. Grains Baked goods made with fat, such as croissants, muffins, or some breads. Dry pasta or rice meal packs. Vegetables Creamed or fried vegetables. Vegetables in a cheese sauce. Regular canned vegetables (not low-sodium or reduced-sodium). Regular canned tomato sauce and paste (not low-sodium or reduced-sodium). Regular tomato and vegetable juice (not low-sodium or reduced-sodium). Angie Fava. Olives. Fruits Canned fruit in a light or heavy syrup. Fried fruit. Fruit in cream or butter sauce. Meat and other protein foods Fatty cuts of meat. Ribs. Fried meat. Berniece Salines.  Sausage. Bologna and other processed lunch meats. Salami. Fatback. Hotdogs. Bratwurst. Salted nuts and seeds. Canned beans with added salt. Canned or smoked fish. Whole eggs or egg yolks. Chicken or [place] with skin. Dairy Whole or 2% milk, cream, and half-and-half. Whole or full-fat cream cheese. Whole-fat or sweetened yogurt. Full-fat cheese. Nondairy creamers. Whipped toppings. Processed cheese and cheese spreads. Fats and oils Butter. Stick margarine. Lard. Shortening. Ghee. Bacon fat. Tropical oils, such as coconut, palm kernel, or palm oil.  Seasoning and other foods Salted popcorn and pretzels. Onion salt, garlic salt, seasoned salt, table salt, and sea salt. Worcestershire sauce. Tartar sauce. Barbecue sauce. Teriyaki sauce. Soy sauce, including reduced-sodium. Steak sauce. Canned and packaged gravies. Fish sauce. Oyster sauce. Cocktail sauce. Horseradish that you find on the shelf. Ketchup. Mustard. Meat flavorings and tenderizers. Bouillon cubes. Hot sauce and Tabasco sauce. Premade or packaged marinades. Premade or packaged taco seasonings. Relishes. Regular salad dressings. Where to find more information:  National Heart, Lung, and [place]: https://wilson-eaton.com/  American Heart Association: www.heart.org Summary  The DASH eating plan is a healthy eating plan that has been shown to reduce high blood pressure (hypertension). It may also reduce your risk for type 2 diabetes, heart disease, and stroke.  With the DASH eating plan, you should limit salt (sodium) intake to 2,300 mg a day. If you have hypertension, you may need to reduce your sodium intake to 1,500 mg a day.  When on the DASH eating plan, aim to eat more fresh fruits and vegetables, whole grains, lean proteins, low-fat dairy, and heart-healthy fats.  Work with your health care provider or diet and nutrition specialist (dietitian) to adjust your eating plan to your individual calorie needs. This information is not intended  to replace advice given to you by your health care provider. Make sure you discuss any questions you have with your health care provider. Document Revised: 05/02/2017 Document Reviewed: 05/13/2016 Elsevier Patient Education  [place].   Cholesterol Content in Foods Cholesterol is a waxy, fat-like substance that helps to carry fat in the blood. The body needs cholesterol in small amounts, but too much cholesterol can cause damage to the arteries and heart. Most people should eat less than 200 milligrams (mg) of cholesterol a day. Foods with cholesterol  Cholesterol is found in animal-based foods, such as meat, seafood, and dairy. Generally, low-fat dairy and lean meats have less cholesterol than full-fat dairy and fatty meats. The milligrams of cholesterol per serving (mg per serving) of common cholesterol-containing foods are listed below. Meat and other proteins  Egg -- one large whole egg has 186 mg.  Veal shank -- 4 oz has 141 mg.  Lean ground [place] (93% lean) -- 4 oz has 118 mg.  Fat-trimmed lamb loin -- 4 oz has 106 mg.  Lean ground beef (90% lean) -- 4 oz has 100 mg.  Lobster -- 3.5 oz has 90 mg.  Pork loin chops -- 4 oz has 86 mg.  Canned salmon -- 3.5 oz has 83 mg.  Fat-trimmed beef top loin -- 4 oz has 78 mg.  Frankfurter -- 1 frank (3.5 oz) has 77 mg.  Crab -- 3.5 oz has 71 mg.  Roasted chicken without skin, white meat -- 4 oz has 66 mg.  Light bologna -- 2 oz has 45 mg.  Deli-cut [place] -- 2 oz has 31 mg.  Canned tuna -- 3.5 oz has 31 mg.  Berniece Salines -- 1 oz has 29 mg.  Oysters and mussels (raw) -- 3.5 oz has 25 mg.  Mackerel -- 1 oz has 22 mg.  Trout -- 1 oz has 20 mg.  Pork sausage -- 1 link (1 oz) has 17 mg.  Salmon -- 1 oz has 16 mg.  Tilapia -- 1 oz has 14 mg. Dairy  Soft-serve ice cream --  cup (4 oz) has 103 mg.  Whole-milk yogurt -- 1 cup (8 oz) has 29 mg.  Cheddar cheese -- 1 oz has 28 mg.  American cheese -- 1 oz has 28  mg.  Whole milk -- 1 cup (8 oz) has 23 mg.  2% milk -- 1 cup (8 oz) has 18 mg.  Cream cheese -- 1 tablespoon (Tbsp) has 15 mg.  Cottage cheese --  cup (4 oz) has 14 mg.  Low-fat (1%) milk -- 1 cup (8 oz) has 10 mg.  Sour cream -- 1 Tbsp has 8.5 mg.  Low-fat yogurt -- 1 cup (8 oz) has 8 mg.  Nonfat Greek yogurt -- 1 cup (8 oz) has 7 mg.  Half-and-half cream -- 1 Tbsp has 5 mg. Fats and oils  Cod liver oil -- 1 tablespoon (Tbsp) has 82 mg.  Butter -- 1 Tbsp has 15 mg.  Lard -- 1 Tbsp has 14 mg.  Bacon grease -- 1 Tbsp has 14 mg.  Mayonnaise -- 1 Tbsp has 5-10 mg.  Margarine -- 1 Tbsp has 3-10 mg. Exact amounts of cholesterol in these foods may vary depending on specific ingredients and brands. Foods without cholesterol Most plant-based foods do not have cholesterol unless you combine them with a food that has cholesterol. Foods without cholesterol include:  Grains and cereals.  Vegetables.  Fruits.  Vegetable oils, such as olive, canola, and sunflower oil.  Legumes, such as peas, beans, and lentils.  Nuts and seeds.  Egg whites. Summary  The body needs cholesterol in small amounts, but too much cholesterol can cause damage to the arteries and heart.  Most people should eat less than 200 milligrams (mg) of cholesterol a day. This information is not intended to replace advice given to you by your health care provider. Make sure you discuss any questions you have with your health care provider. Document Revised: 05/02/2017 Document Reviewed: 01/14/2017 Elsevier Patient Education  [place].  High Cholesterol  High cholesterol is a condition in which the blood has high levels of a white, waxy, fat-like substance (cholesterol). The human body needs small amounts of cholesterol. The liver makes all the cholesterol that the body needs. Extra (excess) cholesterol comes from the food that we eat. Cholesterol is carried from the liver by the blood through  the blood vessels. If you have high cholesterol, deposits (plaques) may build up on the walls of your blood vessels (arteries). Plaques make the arteries narrower and stiffer. Cholesterol plaques increase your risk for heart attack and stroke. Work with your health care provider to keep your cholesterol levels in a healthy range. What increases the risk? This condition is more likely to develop in people who:  Eat foods that are high in animal fat (saturated fat) or cholesterol.  Are overweight.  Are not getting enough exercise.  Have a family history of high cholesterol. What are the signs or symptoms? There are no symptoms of this condition. How is this diagnosed? This condition may be diagnosed from the results of a blood test.  If you are older than age 88, your health care provider may check your cholesterol every 4-6 years.  You may be checked more often if you already have high cholesterol or other risk factors for heart disease. The blood test for cholesterol measures:  "Bad" cholesterol (LDL cholesterol). This is the main type of cholesterol that causes heart disease. The desired level for LDL is less than 100.  "Good" cholesterol (HDL cholesterol). This type helps to protect against heart disease by cleaning the arteries and carrying the LDL away. The desired level for HDL is 60 or higher.  Triglycerides.  These are fats that the body can store or burn for energy. The desired number for triglycerides is lower than 150.  Total cholesterol. This is a measure of the total amount of cholesterol in your blood, including LDL cholesterol, HDL cholesterol, and triglycerides. A healthy number is less than 200. How is this treated? This condition is treated with diet changes, lifestyle changes, and medicines. Diet changes  This may include eating more whole grains, fruits, vegetables, nuts, and fish.  This may also include cutting back on red meat and foods that have a lot of added  sugar. Lifestyle changes  Changes may include getting at least 40 minutes of aerobic exercise 3 times a week. Aerobic exercises include walking, biking, and swimming. Aerobic exercise along with a healthy diet can help you maintain a healthy weight.  Changes may also include quitting smoking. Medicines  Medicines are usually given if diet and lifestyle changes have failed to reduce your cholesterol to healthy levels.  Your health care provider may prescribe a statin medicine. Statin medicines have been shown to reduce cholesterol, which can reduce the risk of heart disease. Follow these instructions at home: Eating and drinking If told by your health care provider:  Eat chicken (without skin), fish, veal, shellfish, ground [place] breast, and round or loin cuts of red meat.  Do not eat fried foods or fatty meats, such as hot dogs and salami.  Eat plenty of fruits, such as apples.  Eat plenty of vegetables, such as broccoli, potatoes, and carrots.  Eat beans, peas, and lentils.  Eat grains such as barley, rice, couscous, and bulgur wheat.  Eat pasta without cream sauces.  Use skim or nonfat milk, and eat low-fat or nonfat yogurt and cheeses.  Do not eat or drink whole milk, cream, ice cream, egg yolks, or hard cheeses.  Do not eat stick margarine or tub margarines that contain trans fats (also called partially hydrogenated oils).  Do not eat saturated tropical oils, such as coconut oil and palm oil.  Do not eat cakes, cookies, crackers, or other baked goods that contain trans fats.  General instructions  Exercise as directed by your health care provider. Increase your activity level with activities such as gardening, walking, and taking the stairs.  Take over-the-counter and prescription medicines only as told by your health care provider.  Do not use any products that contain nicotine or tobacco, such as cigarettes and e-cigarettes. If you need help quitting, ask your  health care provider.  Keep all follow-up visits as told by your health care provider. This is important. Contact a health care provider if:  You are struggling to maintain a healthy diet or weight.  You need help to start on an exercise program.  You need help to stop smoking. Get help right away if:  You have chest pain.  You have trouble breathing. This information is not intended to replace advice given to you by your health care provider. Make sure you discuss any questions you have with your health care provider. Document Revised: 05/23/2017 Document Reviewed: 11/18/2015 Elsevier Patient Education  [place] Headache Without Cause A headache is pain or discomfort that is felt around the head or neck area. There are many causes and types of headaches. In some cases, the cause may not be found. Follow these instructions at home: Watch your condition for any changes. Let your doctor know about them. Take these steps to help with your condition: Managing pain  Take over-the-counter and prescription medicines only as told by your doctor.  Lie down in a dark, quiet room when you have a headache.  If told, put ice on your head and neck area: ? Put ice in a plastic bag. ? Place a towel between your skin and the bag. ? Leave the ice on for 20 minutes, 2-3 times per day.  If told, put heat on the affected area. Use the heat source that your doctor recommends, such as a moist heat pack or a heating pad. ? Place a towel between your skin and the heat source. ? Leave the heat on for 20-30 minutes. ? Remove the heat if your skin turns bright red. This is very important if you are unable to feel pain, heat, or cold. You may have a greater risk of getting burned.  Keep lights dim if bright lights bother you or make your headaches worse. Eating and drinking  Eat meals on a regular schedule.  If you drink alcohol: ? Limit how much you use to:  0-1 drink  a day for women.  0-2 drinks a day for men. ? Be aware of how much alcohol is in your drink. In the [place] one drink equals one 12 oz bottle of beer (355 mL), one 5 oz glass of wine (148 mL), or one 1 oz glass of hard liquor (44 mL).  Stop drinking caffeine, or reduce how much caffeine you drink. General instructions   Keep a journal to find out if certain things bring on headaches. For example, write down: ? What you eat and drink. ? How much sleep you get. ? Any change to your diet or medicines.  Get a massage or try other ways to relax.  Limit stress.  Sit up straight. Do not tighten (tense) your muscles.  Do not use any products that contain nicotine or tobacco. This includes cigarettes, e-cigarettes, and chewing tobacco. If you need help quitting, ask your doctor.  Exercise regularly as told by your doctor.  Get enough sleep. This often means 7-9 hours of sleep each night.  Keep all follow-up visits as told by your doctor. This is important. Contact a doctor if:  Your symptoms are not helped by medicine.  You have a headache that feels different than the other headaches.  You feel sick to your stomach (nauseous) or you throw up (vomit).  You have a fever. Get help right away if:  Your headache gets very bad quickly.  Your headache gets worse after a lot of physical activity.  You keep throwing up.  You have a stiff neck.  You have trouble seeing.  You have trouble speaking.  You have pain in the eye or ear.  Your muscles are weak or you lose muscle control.  You lose your balance or have trouble walking.  You feel like you will pass out (faint) or you pass out.  You are mixed up (confused).  You have a seizure. Summary  A headache is pain or discomfort that is felt around the head or neck area.  There are many causes and types of headaches. In some cases, the cause may not be found.  Keep a journal to help find out what causes your headaches.  Watch your condition for any changes. Let your doctor know about them.  Contact a doctor if you have a headache that is different from usual, or if your headache is not helped by medicine.  Get help right away if your headache  gets very bad, you throw up, you have trouble seeing, you lose your balance, or you have a seizure. This information is not intended to replace advice given to you by your health care provider. Make sure you discuss any questions you have with your health care provider. Document Revised: 12/08/2017 Document Reviewed: 12/08/2017 Elsevier Patient Education  [place].

## 2019-11-17 ENCOUNTER — Encounter: Payer: Self-pay | Admitting: Internal Medicine

## 2019-11-21 NOTE — Addendum Note (Signed)
Addended by: Orland Mustard on: 11/21/2019 09:52 PM   Modules accepted: Orders

## 2019-11-23 NOTE — Telephone Encounter (Signed)
Thressa Sheller, CMA  11/17/2019 9:37 AM EDT Back to Top    Left message to return call.  Patient has seen results on mychart with no response. List mailed.    Nino Glow McLean-Scocuzza, MD  11/16/2019 5:48 PM EDT     Thyroid lab normal continue same dose of thyroid medication  Liver kidneys normal  Cholesterol elevated  -mail cholesterol handout

## 2019-11-29 ENCOUNTER — Other Ambulatory Visit: Payer: Self-pay | Admitting: Internal Medicine

## 2019-11-29 DIAGNOSIS — I10 Essential (primary) hypertension: Secondary | ICD-10-CM

## 2019-11-29 MED ORDER — HYDROCHLOROTHIAZIDE 25 MG PO TABS: 12.5000 mg | ORAL_TABLET | Freq: Every day | ORAL | 3 refills | Status: DC

## 2019-12-02 ENCOUNTER — Encounter: Payer: Self-pay | Admitting: Internal Medicine

## 2019-12-02 ENCOUNTER — Other Ambulatory Visit: Payer: Self-pay

## 2019-12-02 NOTE — Telephone Encounter (Signed)
Spoke with the patient. This has been ongoing for about 4 days. Scheduled her to come in tomorrow, 12/03/19, at 11:30 am.   Patient informed that labs can have a turn around time of 2-3 days and Monday is a Holiday. Results may not be in until Tuesday. Patient requesting that a POC UA is ordered and if abnormal antibiotics be sent in for the weekend.   Please advise

## 2019-12-03 ENCOUNTER — Encounter: Payer: Self-pay | Admitting: Internal Medicine

## 2019-12-03 ENCOUNTER — Ambulatory Visit: Payer: Federal, State, Local not specified - PPO | Admitting: Internal Medicine

## 2019-12-03 VITALS — BP 124/84 | HR 77 | Temp 98.3°F | Ht 64.0 in | Wt 182.4 lb

## 2019-12-03 DIAGNOSIS — I10 Essential (primary) hypertension: Secondary | ICD-10-CM | POA: Diagnosis not present

## 2019-12-03 DIAGNOSIS — B9689 Other specified bacterial agents as the cause of diseases classified elsewhere: Secondary | ICD-10-CM

## 2019-12-03 DIAGNOSIS — N760 Acute vaginitis: Secondary | ICD-10-CM | POA: Diagnosis not present

## 2019-12-03 DIAGNOSIS — Z1389 Encounter for screening for other disorder: Secondary | ICD-10-CM | POA: Diagnosis not present

## 2019-12-03 DIAGNOSIS — N3000 Acute cystitis without hematuria: Secondary | ICD-10-CM

## 2019-12-03 LAB — POCT URINALYSIS DIPSTICK (MANUAL)
Nitrite, UA: NEGATIVE
Poct Bilirubin: NEGATIVE
Poct Blood: NEGATIVE
Poct Glucose: NORMAL mg/dL
Poct Ketones: NEGATIVE
Poct Protein: NEGATIVE mg/dL
Poct Urobilinogen: NORMAL mg/dL
Spec Grav, UA: <=1.005 — AB (ref 1.010–1.025)
pH, UA: 5 (ref 5.0–8.0)

## 2019-12-03 MED ORDER — CIPROFLOXACIN HCL 500 MG PO TABS: 500.0000 mg | ORAL_TABLET | Freq: Two times a day (BID) | ORAL | 0 refills | Status: DC

## 2019-12-03 MED ORDER — AMLODIPINE BESYLATE 5 MG PO TABS: 5.0000 mg | ORAL_TABLET | Freq: Every day | ORAL | 3 refills | Status: DC

## 2019-12-03 MED ORDER — METRONIDAZOLE 0.75 % VA GEL: 1.0000 | Freq: Two times a day (BID) | VAGINAL | 0 refills | Status: DC

## 2019-12-03 NOTE — Patient Instructions (Addendum)
Honey pot washes over the counter     Urinary Tract Infection, Adult  A urinary tract infection (UTI) is an infection of any part of the urinary tract. The urinary tract includes the kidneys, ureters, bladder, and urethra. These organs make, store, and get rid of urine in the body. Your health care provider may use other names to describe the infection. An upper UTI affects the ureters and kidneys (pyelonephritis). A lower UTI affects the bladder (cystitis) and urethra (urethritis). What are the causes? Most urinary tract infections are caused by bacteria in your genital area, around the entrance to your urinary tract (urethra). These bacteria grow and cause inflammation of your urinary tract. What increases the risk? You are more likely to develop this condition if:  You have a urinary catheter that stays in place (indwelling).  You are not able to control when you urinate or have a bowel movement (you have incontinence).  You are female and you: ? Use a spermicide or diaphragm for birth control. ? Have low estrogen levels. ? Are pregnant.  You have certain genes that increase your risk (genetics).  You are sexually active.  You take antibiotic medicines.  You have a condition that causes your flow of urine to slow down, such as: ? An enlarged prostate, if you are female. ? Blockage in your urethra (stricture). ? A kidney stone. ? A nerve condition that affects your bladder control (neurogenic bladder). ? Not getting enough to drink, or not urinating often.  You have certain medical conditions, such as: ? Diabetes. ? A weak disease-fighting system (immunesystem). ? Sickle cell disease. ? Gout. ? Spinal cord injury. What are the signs or symptoms? Symptoms of this condition include:  Needing to urinate right away (urgently).  Frequent urination or passing small amounts of urine frequently.  Pain or burning with urination.  Blood in the urine.  Urine that smells bad  or unusual.  Trouble urinating.  Cloudy urine.  Vaginal discharge, if you are female.  Pain in the abdomen or the lower back. You may also have:  Vomiting or a decreased appetite.  Confusion.  Irritability or tiredness.  A fever.  Diarrhea. The first symptom in older adults may be confusion. In some cases, they may not have any symptoms until the infection has worsened. How is this diagnosed? This condition is diagnosed based on your medical history and a physical exam. You may also have other tests, including:  Urine tests.  Blood tests.  Tests for sexually transmitted infections (STIs). If you have had more than one UTI, a cystoscopy or imaging studies may be done to determine the cause of the infections. How is this treated? Treatment for this condition includes:  Antibiotic medicine.  Over-the-counter medicines to treat discomfort.  Drinking enough water to stay hydrated. If you have frequent infections or have other conditions such as a kidney stone, you may need to see a health care provider who specializes in the urinary tract (urologist). In rare cases, urinary tract infections can cause sepsis. Sepsis is a life-threatening condition that occurs when the body responds to an infection. Sepsis is treated in the hospital with IV antibiotics, fluids, and other medicines. Follow these instructions at home:  Medicines  Take over-the-counter and prescription medicines only as told by your health care provider.  If you were prescribed an antibiotic medicine, take it as told by your health care provider. Do not stop using the antibiotic even if you start to feel better. General instructions  Make sure you: ? Empty your bladder often and completely. Do not hold urine for long periods of time. ? Empty your bladder after sex. ? Wipe from front to back after a bowel movement if you are female. Use each tissue one time when you wipe.  Drink enough fluid to keep your  urine pale yellow.  Keep all follow-up visits as told by your health care provider. This is important. Contact a health care provider if:  Your symptoms do not get better after 1-2 days.  Your symptoms go away and then return. Get help right away if you have:  Severe pain in your back or your lower abdomen.  A fever.  Nausea or vomiting. Summary  A urinary tract infection (UTI) is an infection of any part of the urinary tract, which includes the kidneys, ureters, bladder, and urethra.  Most urinary tract infections are caused by bacteria in your genital area, around the entrance to your urinary tract (urethra).  Treatment for this condition often includes antibiotic medicines.  If you were prescribed an antibiotic medicine, take it as told by your health care provider. Do not stop using the antibiotic even if you start to feel better.  Keep all follow-up visits as told by your health care provider. This is important. This information is not intended to replace advice given to you by your health care provider. Make sure you discuss any questions you have with your health care provider. Document Revised: 05/07/2018 Document Reviewed: 11/27/2017 Elsevier Patient Education  [place].  Bacterial Vaginosis  Bacterial vaginosis is a vaginal infection that occurs when the normal balance of bacteria in the vagina is disrupted. It results from an overgrowth of certain bacteria. This is the most common vaginal infection among women ages 54-44. Because bacterial vaginosis increases your risk for STIs (sexually transmitted infections), getting treated can help reduce your risk for chlamydia, gonorrhea, herpes, and HIV (human immunodeficiency virus). Treatment is also important for preventing complications in pregnant women, because this condition can cause an early (premature) delivery. What are the causes? This condition is caused by an increase in harmful bacteria that are normally  present in small amounts in the vagina. However, the reason that the condition develops is not fully understood. What increases the risk? The following factors may make you more likely to develop this condition:  Having a new sexual partner or multiple sexual partners.  Having unprotected sex.  Douching.  Having an intrauterine device (IUD).  Smoking.  Drug and alcohol abuse.  Taking certain antibiotic medicines.  Being pregnant. You cannot get bacterial vaginosis from toilet seats, bedding, swimming pools, or contact with objects around you. What are the signs or symptoms? Symptoms of this condition include:  Grey or white vaginal discharge. The discharge can also be watery or foamy.  A fish-like odor with discharge, especially after sexual intercourse or during menstruation.  Itching in and around the vagina.  Burning or pain with urination. Some women with bacterial vaginosis have no signs or symptoms. How is this diagnosed? This condition is diagnosed based on:  Your medical history.  A physical exam of the vagina.  Testing a sample of vaginal fluid under a microscope to look for a large amount of bad bacteria or abnormal cells. Your health care provider may use a cotton swab or a small wooden spatula to collect the sample. How is this treated? This condition is treated with antibiotics. These may be given as a pill, a vaginal cream, or  a medicine that is put into the vagina (suppository). If the condition comes back after treatment, a second round of antibiotics may be needed. Follow these instructions at home: Medicines  Take over-the-counter and prescription medicines only as told by your health care provider.  Take or use your antibiotic as told by your health care provider. Do not stop taking or using the antibiotic even if you start to feel better. General instructions  If you have a female sexual partner, tell her that you have a vaginal infection. She  should see her health care provider and be treated if she has symptoms. If you have a female sexual partner, he does not need treatment.  During treatment: ? Avoid sexual activity until you finish treatment. ? Do not douche. ? Avoid alcohol as directed by your health care provider. ? Avoid breastfeeding as directed by your health care provider.  Drink enough water and fluids to keep your urine clear or pale yellow.  Keep the area around your vagina and rectum clean. ? Wash the area daily with warm water. ? Wipe yourself from front to back after using the toilet.  Keep all follow-up visits as told by your health care provider. This is important. How is this prevented?  Do not douche.  Wash the outside of your vagina with warm water only.  Use protection when having sex. This includes latex condoms and dental dams.  Limit how many sexual partners you have. To help prevent bacterial vaginosis, it is best to have sex with just one partner (monogamous).  Make sure you and your sexual partner are tested for STIs.  Wear cotton or cotton-lined underwear.  Avoid wearing tight pants and pantyhose, especially during summer.  Limit the amount of alcohol that you drink.  Do not use any products that contain nicotine or tobacco, such as cigarettes and e-cigarettes. If you need help quitting, ask your health care provider.  Do not use illegal drugs. Where to find more information  Centers for Disease Control and Prevention: AppraiserFraud.fi  American Sexual Health Association (ASHA): www.ashastd.org  [place].S. Department of Health and Financial controller, Office on Women's Health: DustingSprays.pl or SecuritiesCard.it Contact a health care provider if:  Your symptoms do not improve, even after treatment.  You have more discharge or pain when urinating.  You have a fever.  You have pain in your abdomen.  You have pain during sex.  You have  vaginal bleeding between periods. Summary  Bacterial vaginosis is a vaginal infection that occurs when the normal balance of bacteria in the vagina is disrupted.  Because bacterial vaginosis increases your risk for STIs (sexually transmitted infections), getting treated can help reduce your risk for chlamydia, gonorrhea, herpes, and HIV (human immunodeficiency virus). Treatment is also important for preventing complications in pregnant women, because the condition can cause an early (premature) delivery.  This condition is treated with antibiotic medicines. These may be given as a pill, a vaginal cream, or a medicine that is put into the vagina (suppository). This information is not intended to replace advice given to you by your health care provider. Make sure you discuss any questions you have with your health care provider. Document Revised: 05/02/2017 Document Reviewed: 02/03/2016 Elsevier Patient Education  2020 [employer].

## 2019-12-03 NOTE — Progress Notes (Signed)
Chief Complaint  Patient presents with  . Urinary Tract Infection   F/u acute  C/w uti used to get a lot as child sx's started sat/sunday prior to visit not really burning lower pelvic pressure, +odor, increased frequency and pressure toward end of stream. She also has h/o BV and tx'ed 05/2019 for this by obgyn.   Review of Systems  Constitutional: Negative for weight loss.  Respiratory: Negative for shortness of breath.   Cardiovascular: Negative for chest pain.  Gastrointestinal: Positive for abdominal pain.  Genitourinary:       +urine odor  Skin: Negative for rash.   Past Medical History:  Diagnosis Date  . Anxiety   . Depression   . History of chicken pox   . Hypothyroid    h/o abnormal elevated tsh   . MVP (mitral valve prolapse) 2000   resolved 2005  . Ovarian cyst   . Vitamin D deficiency    Past Surgical History:  Procedure Laterality Date  . CYSTOSCOPY     childhood  . REDUCTION MAMMAPLASTY Bilateral 08/2015  . wisdom teeth removal     Family History  Problem Relation Age of Onset  . Hypertension Mother   . Arthritis Mother   . Depression Mother   . Hyperlipidemia Mother   . Miscarriages / [place] Mother   . Thyroid disease Mother   . Uterine cancer Mother        stage 69 dx'ed age 52 s/p hysterectomy/chemo; neg genetic testing  . Breast cancer Paternal Grandmother 88  . Cancer Paternal Grandmother        late 56s   . Diabetes type II Maternal Grandmother   . Arthritis Maternal Grandmother   . Depression Maternal Grandmother   . Diabetes Maternal Grandmother   . Hyperlipidemia Maternal Grandmother   . Hypertension Maternal Grandmother   . Intellectual disability Maternal Grandmother   . Arthritis Father   . Hypertension Father   . Hyperlipidemia Maternal Grandfather   . Cancer Maternal Grandfather        SCC mohs   . Diabetes Paternal Grandfather   . Heart disease Paternal Grandfather    Social History   Socioeconomic History  . Marital  status: Divorced    Spouse name: Not on file  . Number of children: Not on file  . Years of education: Not on file  . Highest education level: Not on file  Occupational History  . Not on file  Tobacco Use  . Smoking status: Never Smoker  . Smokeless tobacco: Never Used  Vaping Use  . Vaping Use: Never used  Substance and Sexual Activity  . Alcohol use: No  . Drug use: No  . Sexual activity: Yes    Birth control/protection: Pill  Other Topics Concern  . 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 Difficulty of Paying Living Expenses:   Food Insecurity:   . Worried About Charity fundraiser in the Last Year:   . Arboriculturist in the Last Year:   Transportation Needs:   . Film/video editor (Medical):   Marland Kitchen Lack of Transportation (Non-Medical):   Physical Activity:   . Days of Exercise per  Week:   . Minutes of Exercise per Session:   Stress:   . Feeling of Stress :   Social Connections:   . Frequency of Communication with Friends and Family:   . Frequency of Social Gatherings with Friends and Family:   . Attends Religious Services:   . Active Member of Clubs or Organizations:   . Attends Archivist Meetings:   Marland Kitchen Marital Status:   Intimate Partner Violence:   . Fear of Current or Ex-Partner:   . Emotionally Abused:   Marland Kitchen Physically Abused:   . Sexually Abused:    Current Meds  Medication Sig  . amLODipine (NORVASC) 5 MG tablet Take 1 tablet (5 mg total) by mouth daily.  . betamethasone dipropionate 0.05 % cream Apply topically 2 (two) times daily as needed.  . cholecalciferol (VITAMIN D) 1000 UNITS tablet Take 2 Units by mouth daily.  . clotrimazole (LOTRIMIN) 1 % cream Apply 1 application topically 2 (two) times daily. heels  .  hydrochlorothiazide (HYDRODIURIL) 25 MG tablet Take 0.5 tablets (12.5 mg total) by mouth daily. In the am  . levothyroxine (SYNTHROID) 88 MCG tablet Take 1 tablet (88 mcg total) by mouth daily before breakfast. 30 minutes before breakfast  . sertraline (ZOLOFT) 50 MG tablet Take 1 tablet (50 mg total) by mouth daily. appt further refills  . [DISCONTINUED] amLODipine (NORVASC) 2.5 MG tablet Take 1 tablet (2.5 mg total) by mouth daily. X 1 week, increase to 5 mg in week 2 daily if needed BP if not <130/<80   Allergies  Allergen Reactions  . Penicillins Hives   Recent Results (from the past 2160 hour(s))  Comprehensive metabolic panel     Status: None   Collection Time: 11/16/19  9:35 AM  Result Value Ref Range   Sodium 135 135 - 145 mEq/L   Potassium 4.6 3.5 - 5.1 mEq/L   Chloride 101 96 - 112 mEq/L   CO2 27 19 - 32 mEq/L   Glucose, Bld 82 70 - 99 mg/dL   BUN 15 6 - 23 mg/dL   Creatinine, Ser 0.82 0.40 - 1.20 mg/dL   Total Bilirubin 0.7 0.2 - 1.2 mg/dL   Alkaline Phosphatase 53 39 - 117 U/L   AST 21 0 - 37 U/L   ALT 16 0 - 35 U/L   Total Protein 7.2 6.0 - 8.3 g/dL   Albumin 4.3 3.5 - 5.2 g/dL   GFR 76.65 >60.00 mL/min   Calcium 9.5 8.4 - 10.5 mg/dL  Lipid panel     Status: Abnormal   Collection Time: 11/16/19  9:35 AM  Result Value Ref Range   Cholesterol 243 (H) 0 - 200 mg/dL    Comment: ATP III Classification       Desirable:  < 200 mg/dL               Borderline High:  200 - 239 mg/dL          High:  > = 240 mg/dL   Triglycerides 183.0 (H) 0 - 149 mg/dL    Comment: Normal:  <150 mg/dLBorderline High:  150 - 199 mg/dL   HDL 62.40 >39.00 mg/dL   VLDL 36.6 0.0 - 40.0 mg/dL   LDL Cholesterol 144 (H) 0 - 99 mg/dL   Total CHOL/HDL Ratio 4     Comment:                Men          Women1/2 Average Risk  3.4          3.3Average Risk          5.0          4.42X Average Risk          9.6          7.13X Average Risk          15.0          11.0                       NonHDL 180.56      Comment: NOTE:  Non-HDL goal should be 30 mg/dL higher than patient's LDL goal (i.e. LDL goal of < 70 mg/dL, would have non-HDL goal of < 100 mg/dL)  TSH     Status: None   Collection Time: 11/16/19  9:35 AM  Result Value Ref Range   TSH 3.87 0.35 - 4.50 uIU/mL  POCT Urinalysis Dip Manual     Status: Abnormal   Collection Time: 12/03/19 12:05 PM  Result Value Ref Range   Spec Grav, UA <=1.005 (A) 1.010 - 1.025   pH, UA 5.0 5.0 - 8.0   Leukocytes, UA Small (1+) (A) Negative   Nitrite, UA Negative Negative   Poct Protein Negative Negative, trace mg/dL   Poct Glucose Normal Normal mg/dL   Poct Ketones Negative Negative   Poct Urobilinogen Normal Normal mg/dL   Poct Bilirubin Negative Negative   Poct Blood Negative Negative, trace   Objective  Body mass index is 31.31 kg/m. Wt Readings from Last 3 Encounters:  12/03/19 182 lb 6.4 oz (82.7 kg)  11/16/19 184 lb (83.5 kg)  11/12/19 150 lb (68 kg)   Temp Readings from Last 3 Encounters:  12/03/19 98.3 F (36.8 C) (Oral)  11/16/19 (!) 97.4 F (36.3 C) (Temporal)  11/12/19 98 F (36.7 C)   BP Readings from Last 3 Encounters:  12/03/19 124/84  11/16/19 140/88  11/12/19 (!) 172/95   Pulse Readings from Last 3 Encounters:  12/03/19 77  11/16/19 78  11/12/19 81  f/u   Physical Exam Vitals and nursing note reviewed.  Constitutional:      Appearance: Normal appearance. She is well-developed and well-groomed. She is obese.  HENT:     Head: Normocephalic and atraumatic.  Eyes:     Conjunctiva/sclera: Conjunctivae normal.     Pupils: Pupils are equal, round, and reactive to light.  Cardiovascular:     Rate and Rhythm: Normal rate and regular rhythm.     Heart sounds: No murmur heard.   Pulmonary:     Effort: Pulmonary effort is normal.     Breath sounds: Normal breath sounds.  Abdominal:     General: Abdomen is flat. Bowel sounds are normal.     Tenderness: There is abdominal tenderness in the suprapubic area.      Comments: Mild ttp suprapubic    Skin:    General: Skin is warm and dry.  Neurological:     General: No focal deficit present.     Mental Status: She is alert and oriented to person, place, and time. Mental status is at baseline.     Gait: Gait normal.  Psychiatric:        Attention and Perception: Attention and perception normal.        Mood and Affect: Mood and affect normal.        Speech: Speech normal.        Behavior:  Behavior normal. Behavior is cooperative.        Thought Content: Thought content normal.        Cognition and Memory: Cognition and memory normal.        Judgment: Judgment normal.     Assessment  Plan  Acute cystitis without hematuria - Plan: Urine Culture, POCT Urinalysis Dip Manual, ciprofloxacin (CIPRO) 500 MG tablet bid x 5 days  Increase water as doing  Azo with pyridium prn  Urine dip slightly cloudy with leuks no blood/nitrites   Essential hypertension - Plan: amLODipine (NORVASC) 5 MG tablet hctz 12.5 mg qd   BV (bacterial vaginosis) - Plan: metroNIDAZOLE (METROGEL) 0.75 % vaginal gel Empirically tx   Provider: Dr. Olivia Mackie McLean-Scocuzza-Internal Medicine

## 2019-12-05 LAB — URINE CULTURE
MICRO NUMBER:: 10663060
SPECIMEN QUALITY:: ADEQUATE

## 2019-12-05 LAB — URINALYSIS, ROUTINE W REFLEX MICROSCOPIC

## 2019-12-08 IMAGING — MG DIGITAL SCREENING BILATERAL MAMMOGRAM WITH TOMO AND CAD
6 of 10 series · 6 of 30 positions shown · non-contrast
Comparison: Previous exam(s).

CLINICAL DATA: Screening. History of bilateral breast reductions.

EXAM:
DIGITAL SCREENING BILATERAL MAMMOGRAM WITH TOMO AND CAD

[L MLO synth-2D]
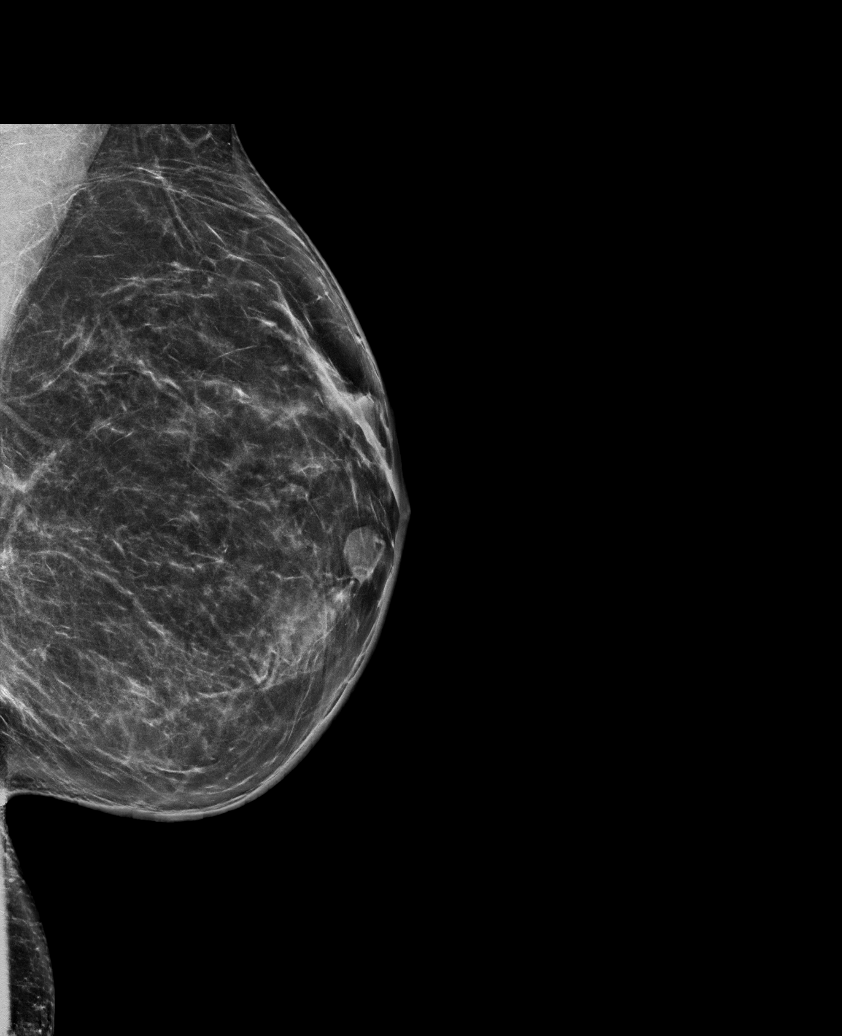

[L CC synth-2D (1 of 2)]
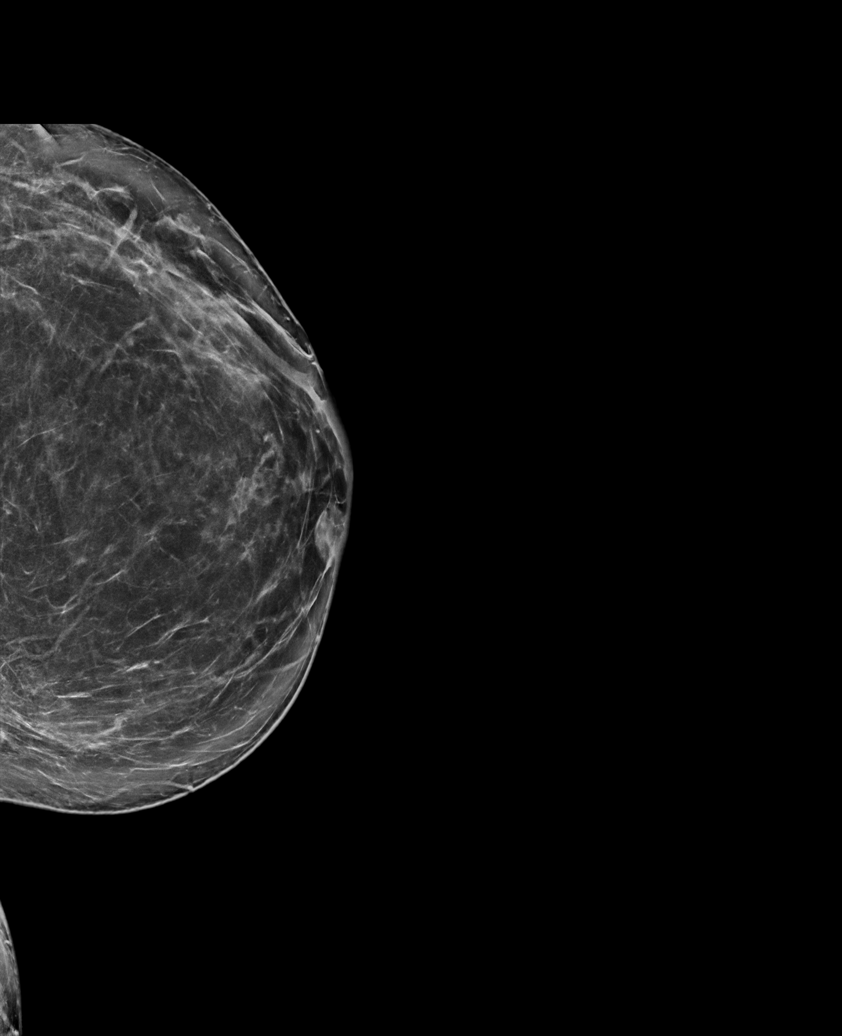

[R CC synth-2D]
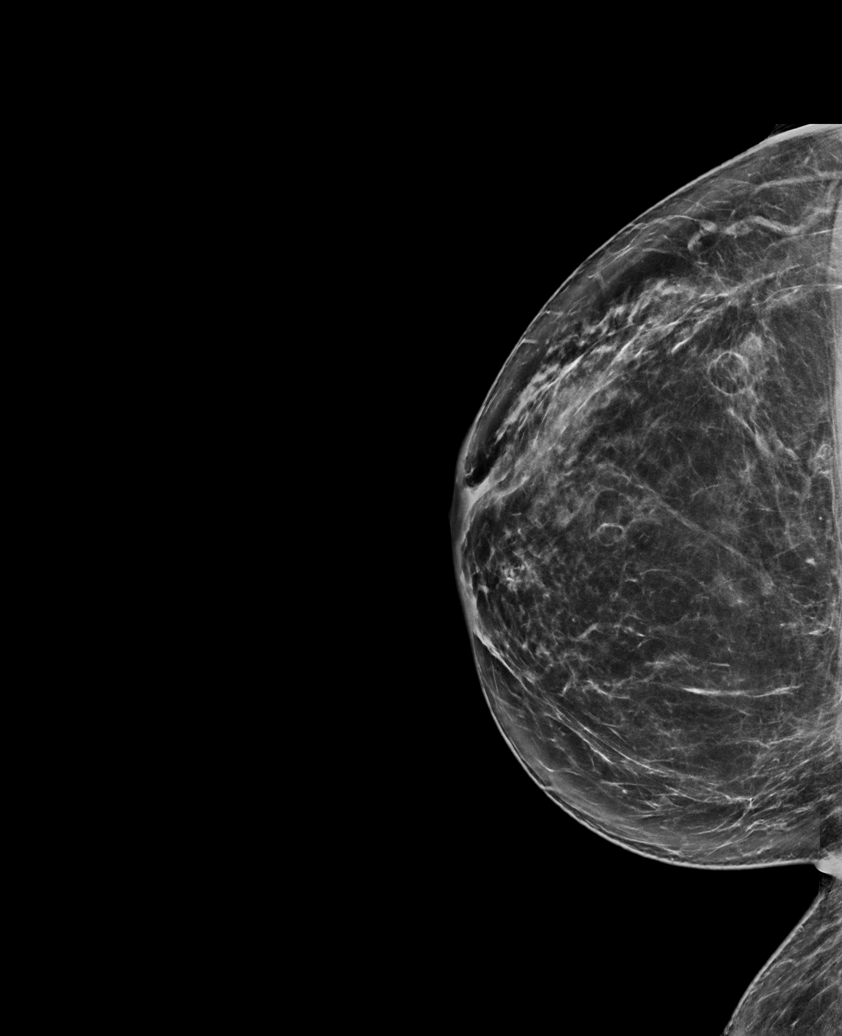

[R MLO synth-2D]
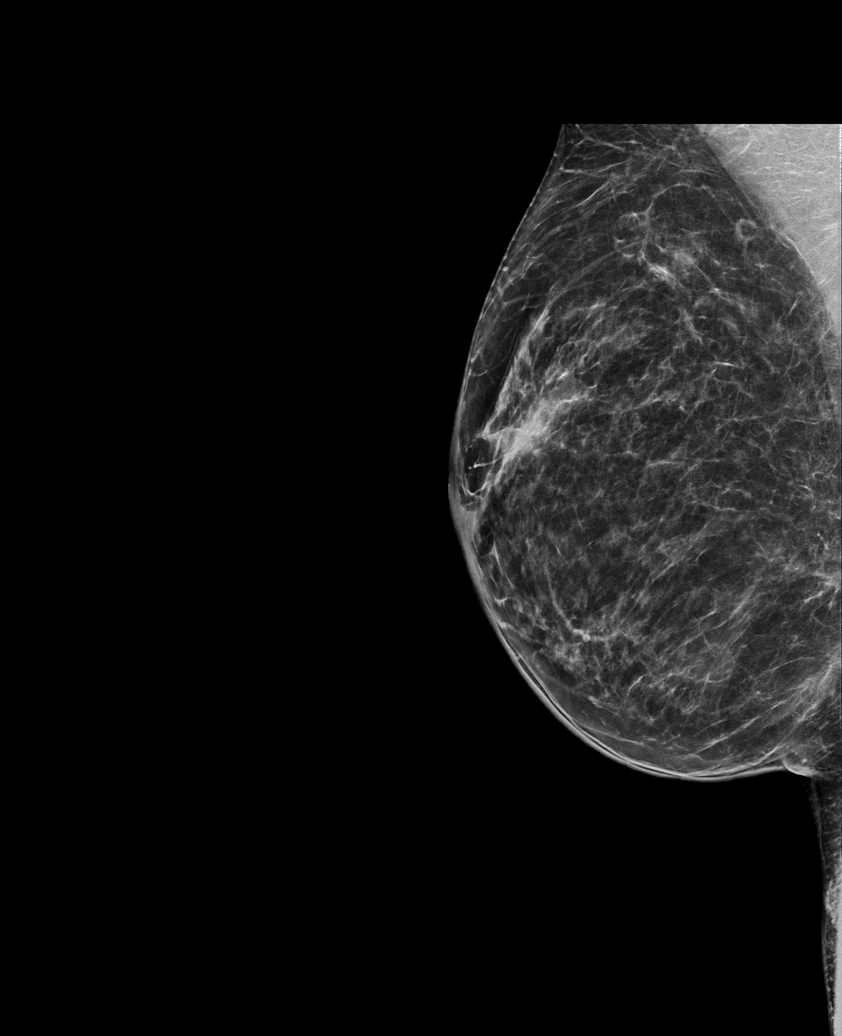

[L CC synth-2D (2 of 2)]
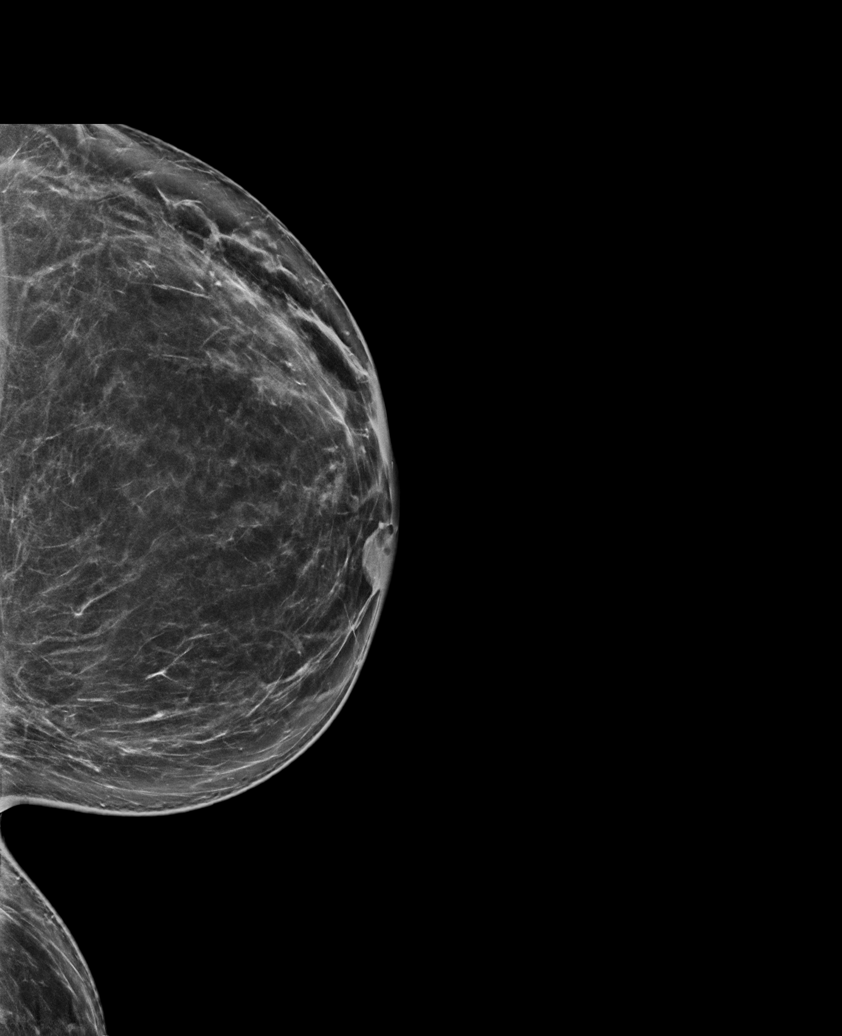

[L MLO tomo · tomo slice 41/80.0]
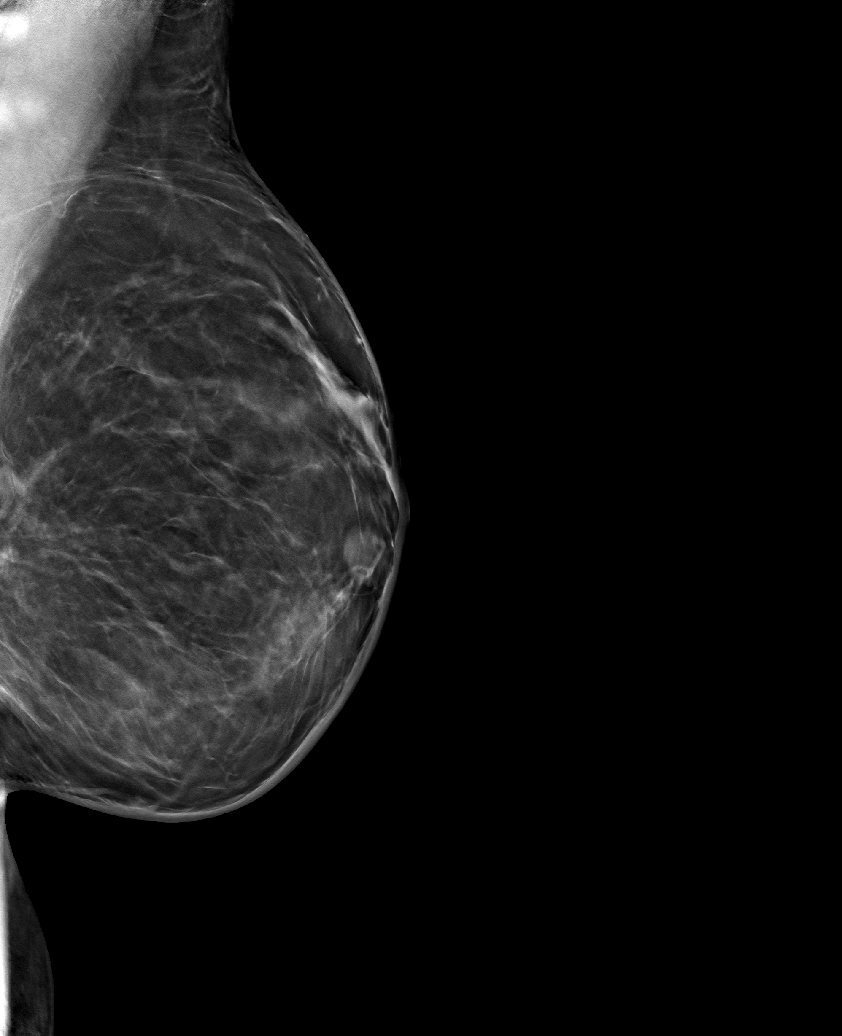

[6 of 30 positions shown; findings below may reference images not displayed]

ACR Breast Density Category b: There are scattered areas of
fibroglandular density.
FINDINGS: There are no findings suspicious for malignancy.

Breast reduction changes and fat necrosis bilaterally again noted.

Images were processed with CAD.
IMPRESSION: No mammographic evidence of malignancy. A result letter of this
screening mammogram will be mailed directly to the patient.

RECOMMENDATION:
Screening mammogram in one year. (Code:D2-V-FZ8)

BI-RADS CATEGORY  2: Benign.

## 2019-12-23 ENCOUNTER — Encounter: Payer: Self-pay | Admitting: Internal Medicine

## 2019-12-27 ENCOUNTER — Other Ambulatory Visit: Payer: Self-pay | Admitting: Internal Medicine

## 2019-12-27 DIAGNOSIS — I10 Essential (primary) hypertension: Secondary | ICD-10-CM

## 2019-12-27 DIAGNOSIS — E876 Hypokalemia: Secondary | ICD-10-CM

## 2019-12-27 NOTE — Addendum Note (Signed)
Addended by: Orland Mustard on: 12/27/2019 01:50 AM   Modules accepted: Orders

## 2020-01-03 ENCOUNTER — Telehealth: Payer: Self-pay | Admitting: Internal Medicine

## 2020-01-03 NOTE — Telephone Encounter (Signed)
Can we look into podiatry referral for patient please and call her  Thanks  Catron  Good afternoon -  I haven't heard from Dr Amalia Hailey office yet about the podiatry appointment - should I just give it more time or call them?    Mitchel Honour  Neomia Dear 7 days ago  TM Will refer to Dr. Laymond Purser in [place] they will call you for an appt   Lets check a basic metabolic panel for potassium, call the office to schedule this and make sure you state this test as I have your labs in for 04/2020 as well and we will do those later

## 2020-01-14 ENCOUNTER — Ambulatory Visit
Admission: EM | Admit: 2020-01-14 | Discharge: 2020-01-14 | Disposition: A | Discharge status: Home / Self Care | Payer: Federal, State, Local not specified - PPO | Attending: Internal Medicine | Admitting: Internal Medicine

## 2020-01-14 DIAGNOSIS — Z20822 Contact with and (suspected) exposure to covid-19: Secondary | ICD-10-CM

## 2020-01-14 DIAGNOSIS — Z0189 Encounter for other specified special examinations: Secondary | ICD-10-CM | POA: Diagnosis not present

## 2020-01-14 NOTE — Discharge Instructions (Signed)
Your COVID test will take 48-72 hours to result. We will only call you if your test comes back positive, however the results will automatically be released to your MyChart account; please set this up if you have not already done so.

## 2020-01-16 LAB — SARS-COV-2, NAA 2 DAY TAT

## 2020-01-16 LAB — NOVEL CORONAVIRUS, NAA: SARS-CoV-2, NAA: NOT DETECTED

## 2020-01-18 ENCOUNTER — Other Ambulatory Visit: Payer: Self-pay

## 2020-01-18 ENCOUNTER — Other Ambulatory Visit (INDEPENDENT_AMBULATORY_CARE_PROVIDER_SITE_OTHER): Payer: Federal, State, Local not specified - PPO

## 2020-01-18 DIAGNOSIS — I10 Essential (primary) hypertension: Secondary | ICD-10-CM

## 2020-01-18 DIAGNOSIS — E876 Hypokalemia: Secondary | ICD-10-CM | POA: Diagnosis not present

## 2020-01-18 LAB — BASIC METABOLIC PANEL
BUN: 14 mg/dL (ref 6–23)
CO2: 30 mEq/L (ref 19–32)
Calcium: 9.9 mg/dL (ref 8.4–10.5)
Chloride: 99 mEq/L (ref 96–112)
Creatinine, Ser: 0.8 mg/dL (ref 0.40–1.20)
GFR: 78.8 mL/min (ref >60.00)
Glucose, Bld: 70 mg/dL (ref 70–99)
Potassium: 4.1 mEq/L (ref 3.5–5.1)
Sodium: 137 mEq/L (ref 135–145)

## 2020-01-19 ENCOUNTER — Encounter: Payer: Self-pay | Admitting: Internal Medicine

## 2020-01-25 NOTE — Telephone Encounter (Signed)
Pt is scheduled on 01/26/2020 at 4:00 pm

## 2020-01-26 ENCOUNTER — Ambulatory Visit: Payer: Federal, State, Local not specified - PPO | Admitting: Podiatry

## 2020-01-26 ENCOUNTER — Ambulatory Visit (INDEPENDENT_AMBULATORY_CARE_PROVIDER_SITE_OTHER): Payer: Federal, State, Local not specified - PPO

## 2020-01-26 ENCOUNTER — Encounter: Payer: Self-pay | Admitting: Podiatry

## 2020-01-26 ENCOUNTER — Other Ambulatory Visit: Payer: Self-pay

## 2020-01-26 DIAGNOSIS — M722 Plantar fascial fibromatosis: Secondary | ICD-10-CM

## 2020-01-26 MED ORDER — MELOXICAM 15 MG PO TABS: 15.0000 mg | ORAL_TABLET | Freq: Every day | ORAL | 3 refills | Status: DC

## 2020-01-26 MED ORDER — METHYLPREDNISOLONE 4 MG PO TBPK: ORAL_TABLET | ORAL | 0 refills | Status: DC

## 2020-01-26 NOTE — Progress Notes (Signed)
Subjective:  Patient ID: Kaitlin Strickland, female    DOB: 1976/02/15,  MRN: [id number] HPI Chief Complaint  Patient presents with  . 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 New Patient (Initial Visit)    44 y.o. female presents with the above complaint.   ROS: Denies fever chills nausea vomiting muscle aches pains calf pain back pain chest pain shortness of breath.  Past Medical History:  Diagnosis Date  . Anxiety   . Depression   . History of chicken pox   . Hypothyroid    h/o abnormal elevated tsh   . MVP (mitral valve prolapse) 2000   resolved 2005  . Ovarian cyst   . Vitamin D deficiency    Past Surgical History:  Procedure Laterality Date  . CYSTOSCOPY     childhood  . REDUCTION MAMMAPLASTY Bilateral 08/2015  . wisdom teeth removal      Current Outpatient Medications:  .  amLODipine (NORVASC) 5 MG tablet, Take 1 tablet (5 mg total) by mouth daily., Disp: 90 tablet, Rfl: 3 .  cholecalciferol (VITAMIN D) 1000 UNITS tablet, Take 2 Units by mouth daily., Disp: , Rfl:  .  hydrochlorothiazide (HYDRODIURIL) 25 MG tablet, Take 0.5 tablets (12.5 mg total) by mouth daily. In the am, Disp: 90 tablet, Rfl: 3 .  levothyroxine (SYNTHROID) 88 MCG tablet, Take 1 tablet (88 mcg total) by mouth daily before breakfast. 30 minutes before breakfast, Disp: 90 tablet, Rfl: 3 .  meloxicam (MOBIC) 15 MG tablet, Take 1 tablet (15 mg total) by mouth daily., Disp: 30 tablet, Rfl: 3 .  methylPREDNISolone (MEDROL DOSEPAK) 4 MG TBPK tablet, 6 day dose pack - take as directed, Disp: 21 tablet, Rfl: 0 .  sertraline (ZOLOFT) 50 MG tablet, Take 1 tablet (50 mg total) by mouth daily. appt further refills, Disp: 90 tablet, Rfl: 2  Allergies  Allergen Reactions  . Penicillins Hives   Review of Systems Objective:  There were  no vitals filed for this visit.  General: Well developed, nourished, in no acute distress, alert and oriented x3   Dermatological: Skin is warm, dry and supple bilateral. Nails x 10 are well maintained; remaining integument appears unremarkable at this time. There are no open sores, no preulcerative lesions, no rash or signs of infection present.  Vascular: Dorsalis Pedis artery and Posterior Tibial artery pedal pulses are 2/4 bilateral with immedate capillary fill time. Pedal hair growth present. No varicosities and no lower extremity edema present bilateral.   Neruologic: Grossly intact via light touch bilateral. Vibratory intact via tuning fork bilateral. Protective threshold with Semmes Wienstein monofilament intact to all pedal sites bilateral. Patellar and Achilles deep tendon reflexes 2+ bilateral. No Babinski or clonus noted bilateral.   Musculoskeletal: No gross boney pedal deformities bilateral. No pain, crepitus, or limitation noted with foot and ankle range of motion bilateral. Muscular strength 5/5 in all groups tested bilateral.  Pain on palpation medial calcaneal tubercle of the right heel.  No pain on palpation of the posterior tibial tendon peroneal tendons or the Achilles or calf.  Gait: Unassisted, Nonantalgic.    Radiographs:  Radiographs taken today demonstrate a small plantar distally oriented calcaneal heel spur and a rectus foot.  Soft tissue increase in density at the plantar fascial kidney insertion site and consistent with plantar  fasciitis.  Assessment & Plan:   Assessment: Discussed etiology pathology conservative versus surgical therapies at this point plantar fasciitis is a diagnosis.  Plan: Discussed etiology pathology conservative surgical therapies at this point injected the right heel with 20 mg Kenalog 5 mg of Marcaine and started on a Medrol Dosepak to be followed by meloxicam.  Placed in a plantar fascial brace and a night splint.  Discussed appropriate  shoe gear stretching exercise ice therapy sugar modifications.     Kennett Symes T. [place], [place]

## 2020-01-26 NOTE — Patient Instructions (Signed)

## 2020-02-03 ENCOUNTER — Other Ambulatory Visit: Payer: Self-pay | Admitting: Dermatology

## 2020-02-03 ENCOUNTER — Ambulatory Visit: Payer: Federal, State, Local not specified - PPO | Admitting: Dermatology

## 2020-02-03 ENCOUNTER — Other Ambulatory Visit: Payer: Self-pay

## 2020-02-03 DIAGNOSIS — D485 Neoplasm of uncertain behavior of skin: Secondary | ICD-10-CM

## 2020-02-03 DIAGNOSIS — D1800 Hemangioma unspecified site: Secondary | ICD-10-CM | POA: Diagnosis not present

## 2020-02-03 DIAGNOSIS — D2262 Melanocytic nevi of left upper limb, including shoulder: Secondary | ICD-10-CM | POA: Diagnosis not present

## 2020-02-03 DIAGNOSIS — Z1283 Encounter for screening for malignant neoplasm of skin: Secondary | ICD-10-CM | POA: Diagnosis not present

## 2020-02-03 DIAGNOSIS — L720 Epidermal cyst: Secondary | ICD-10-CM | POA: Diagnosis not present

## 2020-02-03 DIAGNOSIS — D229 Melanocytic nevi, unspecified: Secondary | ICD-10-CM | POA: Diagnosis not present

## 2020-02-03 DIAGNOSIS — L821 Other seborrheic keratosis: Secondary | ICD-10-CM

## 2020-02-03 DIAGNOSIS — D225 Melanocytic nevi of trunk: Secondary | ICD-10-CM | POA: Diagnosis not present

## 2020-02-03 DIAGNOSIS — D2272 Melanocytic nevi of left lower limb, including hip: Secondary | ICD-10-CM | POA: Diagnosis not present

## 2020-02-03 DIAGNOSIS — L578 Other skin changes due to chronic exposure to nonionizing radiation: Secondary | ICD-10-CM

## 2020-02-03 DIAGNOSIS — L814 Other melanin hyperpigmentation: Secondary | ICD-10-CM

## 2020-02-03 NOTE — Progress Notes (Signed)
New Patient Visit  Subjective  Kaitlin Strickland is a 44 y.o. female who presents for the following: full body skin exam and skin cancer screening  She has no history of skin cancer. Patient does have spots at left upper arm, right underarm, right side and inside of left thigh. They are all irritated, not new or changing. She has not had previous treatment.  No family of skin cancer that patient is aware of.   The following portions of the chart were reviewed this encounter and updated as appropriate:  Tobacco  Allergies  Meds  Problems  Med Hx  Surg Hx  Fam Hx      Review of Systems:  No other skin or systemic complaints except as noted in HPI or Assessment and Plan.  Objective  Well appearing patient in no apparent distress; mood and affect are within normal limits.  A full examination was performed including scalp, head, eyes, ears, nose, lips, neck, chest, axillae, abdomen, back, buttocks, bilateral upper extremities, bilateral lower extremities, hands, feet, fingers, toes, fingernails, and toenails. All findings within normal limits unless otherwise noted below.  Objective  Right Axilla: 0.4cm erythematous tan papule      Objective  Right Flank: 0.5cm erythematous light brown papule      Objective  Left Upper Arm: 0.4cm erythematous tan papule      Objective  Left Inner Thigh: 0.6cm erythematous light brown papule     Objective  Left Shoulder - Posterior: Subcutaneous nodule 1.0cm   Assessment & Plan  Neoplasm of uncertain behavior of skin (4) Right Axilla  Epidermal / dermal shaving  Lesion diameter (cm):  0.4 Informed consent: discussed and consent obtained   Timeout: patient name, date of birth, surgical site, and procedure verified   Patient was prepped and draped in usual sterile fashion: area prepped with isopropyl alcohol. Anesthesia: the lesion was anesthetized in a standard fashion   Anesthetic:  1% lidocaine w/ epinephrine  1-100,000 buffered w/ 8.4% NaHCO3 Instrument used: flexible razor blade   Hemostasis achieved with: aluminum chloride   Outcome: patient tolerated procedure well   Post-procedure details: wound care instructions given   Additional details:  Mupirocin and a bandage applied  Specimen 1 - Surgical pathology Differential Diagnosis: r/o Irritated Nevus vs Acrochordon Check Margins: No 0.4cm erythematous tan papule   Right Flank  Epidermal / dermal shaving  Lesion diameter (cm):  0.5 Informed consent: discussed and consent obtained   Timeout: patient name, date of birth, surgical site, and procedure verified   Patient was prepped and draped in usual sterile fashion: area prepped with isopropyl alcohol. Anesthesia: the lesion was anesthetized in a standard fashion   Anesthetic:  1% lidocaine w/ epinephrine 1-100,000 buffered w/ 8.4% NaHCO3 Instrument used: flexible razor blade   Hemostasis achieved with: aluminum chloride   Outcome: patient tolerated procedure well   Post-procedure details: wound care instructions given   Additional details:  Mupirocin and a bandage applied  Specimen 2 - Surgical pathology Differential Diagnosis: r/o Irritated Nevus vs Acrochordon vs Neurofibroma Check Margins: No 0.5cm erythematous light brown papule   Left Upper Arm  Epidermal / dermal shaving  Lesion diameter (cm):  0.4 Informed consent: discussed and consent obtained   Timeout: patient name, date of birth, surgical site, and procedure verified   Patient was prepped and draped in usual sterile fashion: area prepped with isopropyl alcohol. Anesthesia: the lesion was anesthetized in a standard fashion   Anesthetic:  1% lidocaine w/ epinephrine 1-100,000 buffered  w/ 8.4% NaHCO3 Instrument used: flexible razor blade   Hemostasis achieved with: aluminum chloride   Outcome: patient tolerated procedure well   Post-procedure details: wound care instructions given   Additional details:  Mupirocin  and a bandage applied  Specimen 3 - Surgical pathology Differential Diagnosis: R/o Irritated Nevus Check Margins: No 0.4cm erythematous tan papule   Left Inner Thigh  Epidermal / dermal shaving  Lesion diameter (cm):  0.6 Informed consent: discussed and consent obtained   Timeout: patient name, date of birth, surgical site, and procedure verified   Patient was prepped and draped in usual sterile fashion: area prepped with isopropyl alcohol. Anesthesia: the lesion was anesthetized in a standard fashion   Anesthetic:  1% lidocaine w/ epinephrine 1-100,000 buffered w/ 8.4% NaHCO3 Instrument used: flexible razor blade   Hemostasis achieved with: aluminum chloride   Outcome: patient tolerated procedure well   Post-procedure details: wound care instructions given   Additional details:  Mupirocin and a bandage applied  Specimen 4 - Surgical pathology Differential Diagnosis: R/o Irritated Nevus Check Margins: No 0.6cm erythematous light brown papule  Epidermal inclusion cyst Left Shoulder - Posterior  Benign-appearing.  Observation.  Call clinic for new or changing lesions.  Recommend daily use of broad spectrum spf 30+ sunscreen to sun-exposed areas.    Discussed excision, including resulting scar.     Lentigines - Scattered tan macules - Discussed due to sun exposure - Benign, observe - Call for any changes  Seborrheic Keratoses - Stuck-on, waxy, tan-brown papules and plaques  - Discussed benign etiology and prognosis. - Observe - Call for any changes  Melanocytic Nevi - Tan-brown and/or pink-flesh-colored symmetric macules and papules - Benign appearing on exam today - Observation - Call clinic for new or changing moles - Recommend daily use of broad spectrum spf 30+ sunscreen to sun-exposed areas.   Hemangiomas - Red papules - Discussed benign nature - Observe - Call for any changes  Actinic Damage - diffuse scaly erythematous macules with underlying  dyspigmentation - Recommend daily broad spectrum sunscreen SPF 30+ to sun-exposed areas, reapply every 2 hours as needed.  - Call for new or changing lesions.  Skin cancer screening performed today.   Return if symptoms worsen or fail to improve.  Graciella Belton, RMA, am acting as scribe for Forest Gleason, MD .  Documentation: I have reviewed the above documentation for accuracy and completeness, and I agree with the above.  Forest Gleason, MD

## 2020-02-03 NOTE — Patient Instructions (Addendum)
Melanoma ABCDEs  Melanoma is the most dangerous type of skin cancer, and is the leading cause of death from skin disease.  You are more likely to develop melanoma if you:  Have light-colored skin, light-colored eyes, or red or blond hair  Spend a lot of time in the sun  Tan regularly, either outdoors or in a tanning bed  Have had blistering sunburns, especially during childhood  Have a close family member who has had a melanoma  Have atypical moles or large birthmarks  Early detection of melanoma is key since treatment is typically straightforward and cure rates are extremely high if we catch it early.   The first sign of melanoma is often a change in a mole or a new dark spot.  The ABCDE system is a way of remembering the signs of melanoma.  A for asymmetry:  The two halves do not match. B for border:  The edges of the growth are irregular. C for color:  A mixture of colors are present instead of an even brown color. D for diameter:  Melanomas are usually (but not always) greater than 6mm - the size of a pencil eraser. E for evolution:  The spot keeps changing in size, shape, and color.  Please check your skin once per month between visits. You can use a small mirror in front and a large mirror behind you to keep an eye on the back side or your body.   If you see any new or changing lesions before your next follow-up, please call to schedule a visit.  Please continue daily skin protection including broad spectrum sunscreen SPF 30+ to sun-exposed areas, reapplying every 2 hours as needed when you're outdoors.    Wound Care Instructions  1. Cleanse wound gently with soap and water once a day then pat dry with clean gauze. Apply a thing coat of Petrolatum (petroleum jelly, "Vaseline") over the wound (unless you have an allergy to this). We recommend that you use a new, sterile tube of Vaseline. Do not pick or remove scabs. Do not remove the yellow or white "healing tissue" from the  base of the wound.  2. Cover the wound with fresh, clean, nonstick gauze and secure with paper tape. You may use Band-Aids in place of gauze and tape if the would is small enough, but would recommend trimming much of the tape off as there is often too much. Sometimes Band-Aids can irritate the skin.  3. You should call the office for your biopsy report after 1 week if you have not already been contacted.  4. If you experience any problems, such as abnormal amounts of bleeding, swelling, significant bruising, significant pain, or evidence of infection, please call the office immediately.  5. FOR ADULT SURGERY PATIENTS: If you need something for pain relief you may take 1 extra strength Tylenol (acetaminophen) AND 2 Ibuprofen (200mg each) together every 4 hours as needed for pain. (do not take these if you are allergic to them or if you have a reason you should not take them.) Typically, you may only need pain medication for 1 to 3 days.    Recommend taking Heliocare sun protection supplement daily in sunny weather for additional sun protection. For maximum protection on the sunniest days, you can take up to 2 capsules of regular Heliocare OR take 1 capsule of Heliocare Ultra. For prolonged exposure (such as a full day in the sun), you can repeat your dose of the supplement 4 hours after your   first dose. Heliocare can be purchased at [facility] or at [email or web address].   

## 2020-02-09 NOTE — Progress Notes (Signed)
1. Skin , right axilla MELANOCYTIC NEVUS, INTRADERMAL TYPE 2. Skin , right flank MELANOCYTIC NEVUS, INTRADERMAL TYPE 3. Skin , left upper arm MELANOCYTIC NEVUS, INTRADERMAL TYPE 4. Skin , left inner thigh MELANOCYTIC NEVUS, INTRADERMAL TYPE  These are NORMAL MOLES. No additional treatment is needed. If you notice any new or changing spots or have other skin concerns in future, please call our office at [phone].

## 2020-02-15 ENCOUNTER — Encounter: Payer: Self-pay | Admitting: Dermatology

## 2020-02-16 ENCOUNTER — Ambulatory Visit: Payer: Federal, State, Local not specified - PPO | Admitting: Internal Medicine

## 2020-02-28 ENCOUNTER — Ambulatory Visit (INDEPENDENT_AMBULATORY_CARE_PROVIDER_SITE_OTHER): Payer: Federal, State, Local not specified - PPO | Admitting: Podiatry

## 2020-02-28 ENCOUNTER — Encounter: Payer: Self-pay | Admitting: Podiatry

## 2020-02-28 ENCOUNTER — Other Ambulatory Visit: Payer: Self-pay

## 2020-02-28 DIAGNOSIS — M722 Plantar fascial fibromatosis: Secondary | ICD-10-CM

## 2020-02-28 NOTE — Progress Notes (Signed)
She presents today for follow-up of her plantar fasciitis of her right foot she states that I get cramps in my foot occasionally but it seems to be doing better.  She states that is at least 50% improved but she would like for her to be 800% improved she continues to take her NSAIDs and wearing her plantar fascial brace.  Objective: Vital signs are stable she is alert and oriented x3.  There is no erythema edema cellulitis drainage or odor she has pain on palpation medial calcaneal tubercle of the right heel.  Assessment: Well-healing plantar fasciitis greater than 50% right foot.  Plan: At this point I encouraged her to continue her anti-inflammatories her medication her new shoes and her plantar fascial brace as well as an injection which we placed today 20 mg Kenalog 5 mg of Marcaine.  She tolerated procedure well without complications follow-up with her in 1 month if necessary we did discuss possible need for orthotics.

## 2020-03-21 ENCOUNTER — Other Ambulatory Visit: Payer: Self-pay

## 2020-03-21 DIAGNOSIS — E039 Hypothyroidism, unspecified: Secondary | ICD-10-CM

## 2020-03-21 MED ORDER — LEVOTHYROXINE SODIUM 88 MCG PO TABS: 88.0000 ug | ORAL_TABLET | Freq: Every day | ORAL | 3 refills | Status: DC

## 2020-04-04 ENCOUNTER — Other Ambulatory Visit: Payer: Self-pay

## 2020-04-04 ENCOUNTER — Other Ambulatory Visit (INDEPENDENT_AMBULATORY_CARE_PROVIDER_SITE_OTHER): Payer: Federal, State, Local not specified - PPO

## 2020-04-04 DIAGNOSIS — E039 Hypothyroidism, unspecified: Secondary | ICD-10-CM

## 2020-04-04 DIAGNOSIS — I10 Essential (primary) hypertension: Secondary | ICD-10-CM | POA: Diagnosis not present

## 2020-04-04 DIAGNOSIS — E785 Hyperlipidemia, unspecified: Secondary | ICD-10-CM

## 2020-04-04 DIAGNOSIS — Z1389 Encounter for screening for other disorder: Secondary | ICD-10-CM

## 2020-04-04 LAB — COMPREHENSIVE METABOLIC PANEL
ALT: 31 U/L (ref 0–35)
AST: 23 U/L (ref 0–37)
Albumin: 4.2 g/dL (ref 3.5–5.2)
Alkaline Phosphatase: 85 U/L (ref 39–117)
BUN: 17 mg/dL (ref 6–23)
CO2: 30 mEq/L (ref 19–32)
Calcium: 9.4 mg/dL (ref 8.4–10.5)
Chloride: 101 mEq/L (ref 96–112)
Creatinine, Ser: 0.86 mg/dL (ref 0.40–1.20)
GFR: 83.66 mL/min (ref >60.00)
Glucose, Bld: 81 mg/dL (ref 70–99)
Potassium: 4.8 mEq/L (ref 3.5–5.1)
Sodium: 139 mEq/L (ref 135–145)
Total Bilirubin: 0.6 mg/dL (ref 0.2–1.2)
Total Protein: 6.7 g/dL (ref 6.0–8.3)

## 2020-04-04 LAB — CBC WITH DIFFERENTIAL/PLATELET
Basophils Absolute: 0.1 10*3/uL (ref 0.0–0.1)
Basophils Relative: 1.2 % (ref 0.0–3.0)
Eosinophils Absolute: 0.1 10*3/uL (ref 0.0–0.7)
Eosinophils Relative: 2.1 % (ref 0.0–5.0)
HCT: 42.6 % (ref 36.0–46.0)
Hemoglobin: 14.4 g/dL (ref 12.0–15.0)
Lymphocytes Relative: 39.6 % (ref 12.0–46.0)
Lymphs Abs: 2.4 10*3/uL (ref 0.7–4.0)
MCHC: 33.8 g/dL (ref 30.0–36.0)
MCV: 89.3 fl (ref 78.0–100.0)
Monocytes Absolute: 0.3 10*3/uL (ref 0.1–1.0)
Monocytes Relative: 5.7 % (ref 3.0–12.0)
Neutro Abs: 3.1 10*3/uL (ref 1.4–7.7)
Neutrophils Relative %: 51.4 % (ref 43.0–77.0)
Platelets: 283 10*3/uL (ref 150.0–400.0)
RBC: 4.77 Mil/uL (ref 3.87–5.11)
RDW: 13.2 % (ref 11.5–15.5)
WBC: 6 10*3/uL (ref 4.0–10.5)

## 2020-04-04 LAB — TSH: TSH: 3.69 u[IU]/mL (ref 0.35–4.50)

## 2020-04-04 LAB — LIPID PANEL
Cholesterol: 218 mg/dL — ABNORMAL HIGH (ref 0–200)
HDL: 61.6 mg/dL (ref >39.00)
LDL Cholesterol: 130 mg/dL — ABNORMAL HIGH (ref 0–99)
NonHDL: 156.76
Total CHOL/HDL Ratio: 4
Triglycerides: 135 mg/dL (ref 0.0–149.0)
VLDL: 27 mg/dL (ref 0.0–40.0)

## 2020-04-05 LAB — URINALYSIS, ROUTINE W REFLEX MICROSCOPIC
Bacteria, UA: NONE SEEN /HPF
Bilirubin Urine: NEGATIVE
Glucose, UA: NEGATIVE
Hgb urine dipstick: NEGATIVE
Hyaline Cast: NONE SEEN /LPF
Ketones, ur: NEGATIVE
Nitrite: NEGATIVE
Protein, ur: NEGATIVE
Specific Gravity, Urine: 1.016 (ref 1.001–1.03)
pH: 6.5 (ref 5.0–8.0)

## 2020-04-06 ENCOUNTER — Encounter: Payer: Self-pay | Admitting: Podiatry

## 2020-04-07 ENCOUNTER — Ambulatory Visit: Payer: Federal, State, Local not specified - PPO | Admitting: Internal Medicine

## 2020-04-10 ENCOUNTER — Ambulatory Visit: Payer: Federal, State, Local not specified - PPO | Admitting: Podiatry

## 2020-04-12 ENCOUNTER — Encounter: Payer: Self-pay | Admitting: Podiatry

## 2020-04-21 ENCOUNTER — Other Ambulatory Visit: Payer: Self-pay

## 2020-04-21 ENCOUNTER — Encounter: Payer: Self-pay | Admitting: Internal Medicine

## 2020-04-21 ENCOUNTER — Ambulatory Visit: Payer: Federal, State, Local not specified - PPO | Admitting: Internal Medicine

## 2020-04-21 VITALS — BP 120/80 | HR 70 | Temp 97.9°F | Ht 64.0 in | Wt 187.8 lb

## 2020-04-21 DIAGNOSIS — Z1389 Encounter for screening for other disorder: Secondary | ICD-10-CM

## 2020-04-21 DIAGNOSIS — E785 Hyperlipidemia, unspecified: Secondary | ICD-10-CM | POA: Diagnosis not present

## 2020-04-21 DIAGNOSIS — M722 Plantar fascial fibromatosis: Secondary | ICD-10-CM | POA: Diagnosis not present

## 2020-04-21 DIAGNOSIS — F419 Anxiety disorder, unspecified: Secondary | ICD-10-CM

## 2020-04-21 DIAGNOSIS — E039 Hypothyroidism, unspecified: Secondary | ICD-10-CM

## 2020-04-21 DIAGNOSIS — I10 Essential (primary) hypertension: Secondary | ICD-10-CM

## 2020-04-21 MED ORDER — SERTRALINE HCL 50 MG PO TABS: 50.0000 mg | ORAL_TABLET | Freq: Every day | ORAL | 3 refills | Status: DC

## 2020-04-21 NOTE — Progress Notes (Signed)
Chief Complaint  Patient presents with  . Follow-up   F/u  1. HTN/HLD on norvasc 5 mg qd and hctz 12.5 mg qd BP running 130s/80s low Trying more exercise and healthy diet choices  2. Hypothyroidism on levo 88 mcg doing well  3. Right planter fascitis had 2 steroid injection Dr. Milinda Pointer and pain improved   Review of Systems  Constitutional: Negative for weight loss.  HENT: Negative for hearing loss.   Eyes: Negative for blurred vision.  Respiratory: Negative for shortness of breath.   Cardiovascular: Negative for chest pain.  Gastrointestinal: Negative for abdominal pain.  Musculoskeletal: Negative for falls and joint pain.  Skin: Negative for rash.  Neurological: Negative for headaches.  Psychiatric/Behavioral: The patient is not nervous/anxious.    Past Medical History:  Diagnosis Date  . Anxiety   . Depression   . History of chicken pox   . Hypothyroid    h/o abnormal elevated tsh   . MVP (mitral valve prolapse) 2000   resolved 2005  . Ovarian cyst   . Vitamin D deficiency    Past Surgical History:  Procedure Laterality Date  . CYSTOSCOPY     childhood  . REDUCTION MAMMAPLASTY Bilateral 08/2015  . wisdom teeth removal     Family History  Problem Relation Age of Onset  . Hypertension Mother   . Arthritis Mother   . Depression Mother   . Hyperlipidemia Mother   . Miscarriages / [place] Mother   . Thyroid disease Mother   . Uterine cancer Mother        stage 41 dx'ed age 41 s/p hysterectomy/chemo; neg genetic testing  . Breast cancer Paternal Grandmother 25  . Cancer Paternal Grandmother        late 32s   . Diabetes type II Maternal Grandmother   . Arthritis Maternal Grandmother   . Depression Maternal Grandmother   . Diabetes Maternal Grandmother   . Hyperlipidemia Maternal Grandmother   . Hypertension Maternal Grandmother   . Intellectual disability Maternal Grandmother   . Arthritis Father   . Hypertension Father   . Hyperlipidemia Maternal  Grandfather   . Cancer Maternal Grandfather        SCC mohs   . Diabetes Paternal Grandfather   . Heart disease Paternal Grandfather    Social History   Socioeconomic History  . Marital status: Divorced    Spouse name: Not on file  . Number of children: Not on file  . Years of education: Not on file  . Highest education level: Not on file  Occupational History  . Not on file  Tobacco Use  . Smoking status: Never Smoker  . Smokeless tobacco: Never Used  Vaping Use  . Vaping Use: Never used  Substance and Sexual Activity  . Alcohol use: No  . Drug use: No  . Sexual activity: Yes    Birth control/protection: Pill  Other Topics Concern  . 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 Difficulty of Paying Living Expenses: Not on file  Food Insecurity:   . Worried About Charity fundraiser in the Last Year: Not on file  . Ran Out of Food in the Last Year:  Not on file  Transportation Needs:   . Lack of Transportation (Medical): Not on file  . Lack of Transportation (Non-Medical): Not on file  Physical Activity:   . Days of Exercise per Week: Not on file  . Minutes of Exercise per Session: Not on file  Stress:   . Feeling of Stress : Not on file  Social Connections:   . Frequency of Communication with Friends and Family: Not on file  . Frequency of Social Gatherings with Friends and Family: Not on file  . Attends Religious Services: Not on file  . Active Member of Clubs or Organizations: Not on file  . Attends Archivist Meetings: Not on file  . Marital Status: Not on file  Intimate Partner Violence:   . Fear of Current or Ex-Partner: Not on file  . Emotionally Abused: Not on file  . Physically Abused: Not on file  . Sexually Abused: Not on file    Current Meds  Medication Sig  . amLODipine (NORVASC) 5 MG tablet Take 1 tablet (5 mg total) by mouth daily.  . cholecalciferol (VITAMIN D) 1000 UNITS tablet Take 2 Units by mouth daily.  . hydrochlorothiazide (HYDRODIURIL) 25 MG tablet Take 0.5 tablets (12.5 mg total) by mouth daily. In the am  . levothyroxine (SYNTHROID) 88 MCG tablet Take 1 tablet (88 mcg total) by mouth daily before breakfast. 30 minutes before breakfast  . meloxicam (MOBIC) 15 MG tablet Take 1 tablet (15 mg total) by mouth daily.  . sertraline (ZOLOFT) 50 MG tablet Take 1 tablet (50 mg total) by mouth daily. appt further refills  . [DISCONTINUED] sertraline (ZOLOFT) 50 MG tablet Take 1 tablet (50 mg total) by mouth daily. appt further refills   Allergies  Allergen Reactions  . Penicillins Hives   Recent Results (from the past 2160 hour(s))  TSH     Status: None   Collection Time: 04/04/20  8:04 AM  Result Value Ref Range   TSH 3.69 0.35 - 4.50 uIU/mL  CBC with Differential/Platelet     Status: None   Collection Time: 04/04/20  8:04 AM  Result Value Ref Range   WBC 6.0 4.0 - 10.5 K/uL   RBC 4.77 3.87 - 5.11 Mil/uL   Hemoglobin 14.4 12.0 - 15.0 g/dL   HCT 42.6 36 - 46 %   MCV 89.3 78.0 - 100.0 fl   MCHC 33.8 30.0 - 36.0 g/dL   RDW 13.2 11.5 - 15.5 %   Platelets 283.0 150 - 400 K/uL   Neutrophils Relative % 51.4 43 - 77 %   Lymphocytes Relative 39.6 12 - 46 %   Monocytes Relative 5.7 3 - 12 %   Eosinophils Relative 2.1 0 - 5 %   Basophils Relative 1.2 0 - 3 %   Neutro Abs 3.1 1.4 - 7.7 K/uL   Lymphs Abs 2.4 0.7 - 4.0 K/uL   Monocytes Absolute 0.3 0.1 - 1.0 K/uL   Eosinophils Absolute 0.1 0.0 - 0.7 K/uL   Basophils Absolute 0.1 0.0 - 0.1 K/uL  Lipid panel     Status: Abnormal   Collection Time: 04/04/20  8:04 AM  Result Value Ref Range   Cholesterol 218 (H) 0 - 200 mg/dL    Comment: ATP III Classification       Desirable:  < 200 mg/dL               Borderline High:  200 - 239 mg/dL  High:  > =  240 mg/dL   Triglycerides 135.0 0 - 149 mg/dL    Comment: Normal:  <150 mg/dLBorderline High:  150 - 199 mg/dL   HDL 61.60 >39.00 mg/dL   VLDL 27.0 0.0 - 40.0 mg/dL   LDL Cholesterol 130 (H) 0 - 99 mg/dL   Total CHOL/HDL Ratio 4     Comment:                Men          Women1/2 Average Risk     3.4          3.3Average Risk          5.0          4.42X Average Risk          9.6          7.13X Average Risk          15.0          11.0                       NonHDL 156.76     Comment: NOTE:  Non-HDL goal should be 30 mg/dL higher than patient's LDL goal (i.e. LDL goal of < 70 mg/dL, would have non-HDL goal of < 100 mg/dL)  Comprehensive metabolic panel     Status: None   Collection Time: 04/04/20  8:04 AM  Result Value Ref Range   Sodium 139 135 - 145 mEq/L   Potassium 4.8 3.5 - 5.1 mEq/L   Chloride 101 96 - 112 mEq/L   CO2 30 19 - 32 mEq/L   Glucose, Bld 81 70 - 99 mg/dL   BUN 17 6 - 23 mg/dL   Creatinine, Ser 0.86 0.40 - 1.20 mg/dL   Total Bilirubin 0.6 0.2 - 1.2 mg/dL   Alkaline Phosphatase 85 39 - 117 U/L   AST 23 0 - 37 U/L   ALT 31 0 - 35 U/L   Total Protein 6.7 6.0 - 8.3 g/dL   Albumin 4.2 3.5 - 5.2 g/dL   GFR 83.66 >60.00 mL/min    Comment: Calculated using the CKD-EPI Creatinine Equation (2021)   Calcium 9.4 8.4 - 10.5 mg/dL  Urinalysis, Routine w reflex microscopic     Status: Abnormal   Collection Time: 04/04/20  8:04 AM  Result Value Ref Range   Color, Urine YELLOW YELLOW   APPearance CLEAR CLEAR   Specific Gravity, Urine 1.016 1.001 - 1.03   pH 6.5 5.0 - 8.0   Glucose, UA NEGATIVE NEGATIVE   Bilirubin Urine NEGATIVE NEGATIVE   Ketones, ur NEGATIVE NEGATIVE   Hgb urine dipstick NEGATIVE NEGATIVE   Protein, ur NEGATIVE NEGATIVE   Nitrite NEGATIVE NEGATIVE   Leukocytes,Ua 1+ (A) NEGATIVE   WBC, UA 0-5 0 - 5 /HPF   RBC / HPF 0-2 0 - 2 /HPF   Squamous Epithelial / LPF 6-10 (A) < OR = 5 /HPF   Bacteria, UA NONE SEEN NONE SEEN /HPF   Hyaline Cast NONE SEEN NONE SEEN  /LPF   Objective  Body mass index is 32.24 kg/m. Wt Readings from Last 3 Encounters:  04/21/20 187 lb 12.8 oz (85.2 kg)  12/03/19 182 lb 6.4 oz (82.7 kg)  11/16/19 184 lb (83.5 kg)   Temp Readings from Last 3 Encounters:  04/21/20 97.9 F (36.6 C) (Oral)  01/14/20 98.7 F (37.1 C)  12/03/19 98.3 F (36.8 C) (Oral)   BP  Readings from Last 3 Encounters:  04/21/20 120/80  01/14/20 (!) 132/55  12/03/19 124/84   Pulse Readings from Last 3 Encounters:  04/21/20 70  01/14/20 83  12/03/19 77    Physical Exam Vitals and nursing note reviewed.  Constitutional:      Appearance: Normal appearance. She is well-developed and well-groomed. She is obese.  HENT:     Head: Normocephalic and atraumatic.  Eyes:     Conjunctiva/sclera: Conjunctivae normal.     Pupils: Pupils are equal, round, and reactive to light.  Cardiovascular:     Rate and Rhythm: Normal rate and regular rhythm.     Heart sounds: Normal heart sounds. No murmur heard.   Pulmonary:     Effort: Pulmonary effort is normal.     Breath sounds: Normal breath sounds.  Skin:    General: Skin is warm and dry.  Neurological:     General: No focal deficit present.     Mental Status: She is alert and oriented to person, place, and time. Mental status is at baseline.     Coordination: Romberg sign negative.  Psychiatric:        Attention and Perception: Attention and perception normal.        Mood and Affect: Mood and affect normal.        Speech: Speech normal.        Behavior: Behavior normal. Behavior is cooperative.        Thought Content: Thought content normal.        Cognition and Memory: Cognition and memory normal.        Judgment: Judgment normal.     Assessment  Plan  Hypertension, unspecified type - Plan: Comprehensive metabolic panel, Lipid panel, CBC with Differential/Platelet Cont norvasc 5 mg qd and hctz 12.5 mg qd  Hyperlipidemia, unspecified hyperlipidemia type - Plan: Lipid panel  Anxiety -  Plan: sertraline (ZOLOFT) 50 MG tablet  Plantar fasciitis, right S/p 2 steroids injection f/u Dr. Milinda Pointer   Hypothyroidism, unspecified type - Plan: TSH Levo 88 mcg   HM Flu shot [facility] 02/26/19 covid 3/3 Tdaputd Hep B new vx2/2 immune MMR immune mammogram 05/15/18 negative, 12/18/2020neg order in sch 05/22/20  Colonoscopy age 20   Pap 05/18/18 neg HPV neg pap [facility]12/21/20FH uterine cancer mom Labs 04/03/2020 [facility] appt 02/2020 no issues removal benign lesions x 4 healed  Cont healthy diet and exercise  Provider: Dr. Olivia Mackie McLean-Scocuzza-Internal Medicine

## 2020-04-21 NOTE — Patient Instructions (Signed)

## 2020-05-22 ENCOUNTER — Other Ambulatory Visit: Payer: Self-pay

## 2020-05-22 ENCOUNTER — Ambulatory Visit
Admission: RE | Admit: 2020-05-22 | Discharge: 2020-05-22 | Disposition: A | Discharge status: Home / Self Care | Payer: Federal, State, Local not specified - PPO | Source: Ambulatory Visit | Attending: Internal Medicine | Admitting: Internal Medicine

## 2020-05-22 DIAGNOSIS — Z1231 Encounter for screening mammogram for malignant neoplasm of breast: Secondary | ICD-10-CM | POA: Insufficient documentation

## 2020-06-07 ENCOUNTER — Encounter: Payer: Self-pay | Admitting: Obstetrics and Gynecology

## 2020-06-08 ENCOUNTER — Ambulatory Visit: Payer: Federal, State, Local not specified - PPO | Admitting: Obstetrics and Gynecology

## 2020-07-18 ENCOUNTER — Ambulatory Visit (INDEPENDENT_AMBULATORY_CARE_PROVIDER_SITE_OTHER): Payer: Federal, State, Local not specified - PPO | Admitting: Obstetrics and Gynecology

## 2020-07-18 ENCOUNTER — Other Ambulatory Visit: Payer: Self-pay

## 2020-07-18 ENCOUNTER — Encounter: Payer: Self-pay | Admitting: Obstetrics and Gynecology

## 2020-07-18 VITALS — BP 108/80 | Ht 62.0 in | Wt 191.0 lb

## 2020-07-18 DIAGNOSIS — Z1231 Encounter for screening mammogram for malignant neoplasm of breast: Secondary | ICD-10-CM | POA: Diagnosis not present

## 2020-07-18 DIAGNOSIS — Z01419 Encounter for gynecological examination (general) (routine) without abnormal findings: Secondary | ICD-10-CM

## 2020-07-18 NOTE — Patient Instructions (Signed)
I value your feedback and you entrusting us with your care. If you get a [facility] patient survey, I would appreciate you taking the time to let us know about your experience today. Thank you! ? ? ?

## 2020-07-18 NOTE — Progress Notes (Signed)
PCP:  McLean-Scocuzza, Nino Glow, MD   Chief Complaint  Patient presents with  . Gynecologic Exam    No concerns     HPI:      Ms. Kaitlin Strickland is a 45 y.o. No obstetric history on file. who LMP was Patient's last menstrual period was 07/04/2020 (exact date)., presents today for her annual examination.  Her menses are monthly, lasting 2-3 days, light flow. Stopped OCPs due to new HTN dx. Dysmenorrhea none. She does not have intermenstrual bleeding. Menstrual headaches resolved off OCPs.   Sex activity: currently sexually active--contraception vasectomy.  Last Pap: 05/18/18  Results were: no abnormalities /neg HPV DNA  Hx of STDs: none  Last Mammogram: 05/22/20 with PCP. Results: normal. Repeat in 1 yr. There is a FH of breast cancer in her PGM, mat grt aunt, genetic testing not indicated. There is no FH of ovarian cancer. Her mom was diagnosed with uterine cancer last yr and was "gene neg". Has returned to mets in lymph nodes recently, doing tx. The patient does do self-breast exams. Had burn on LT breast last yr due to heating pad use; sx resolved. Doing fine.  Tobacco use: The patient denies current or previous tobacco use. Alcohol use: none No drug use.  Exercise: moderately active  She does get adequate calcium and Vitamin D in her diet.  Meds/labs with PCP   Past Medical History:  Diagnosis Date  . Anxiety   . Depression   . History of chicken pox   . Hypothyroid    h/o abnormal elevated tsh   . MVP (mitral valve prolapse) 2000   resolved 2005  . Ovarian cyst   . Vitamin D deficiency     Past Surgical History:  Procedure Laterality Date  . CYSTOSCOPY     childhood  . REDUCTION MAMMAPLASTY Bilateral 08/2015  . wisdom teeth removal      Family History  Problem Relation Age of Onset  . Hypertension Mother   . Arthritis Mother   . Depression Mother   . Hyperlipidemia Mother   . Miscarriages / [place] Mother   . Thyroid disease Mother   . Uterine cancer  Mother        stage 70 dx'ed age 54 s/p hysterectomy/chemo; neg genetic testing  . Breast cancer Paternal Grandmother 65  . Cancer Paternal Grandmother        late 37s   . Diabetes type II Maternal Grandmother   . Arthritis Maternal Grandmother   . Depression Maternal Grandmother   . Diabetes Maternal Grandmother   . Hyperlipidemia Maternal Grandmother   . Hypertension Maternal Grandmother   . Intellectual disability Maternal Grandmother   . Arthritis Father   . Hypertension Father   . Hyperlipidemia Maternal Grandfather   . Cancer Maternal Grandfather        SCC mohs   . Diabetes Paternal Grandfather   . Heart disease Paternal Grandfather     Social History   Socioeconomic History  . Marital status: Divorced    Spouse name: Not on file  . Number of children: Not on file  . Years of education: Not on file  . Highest education level: Not on file  Occupational History  . Not on file  Tobacco Use  . Smoking status: Never Smoker  . Smokeless tobacco: Never Used  Vaping Use  . Vaping Use: Never used  Substance and Sexual Activity  . Alcohol use: No  . Drug use: No  . Sexual activity: Yes  Birth control/protection: None, Surgical    Comment: Vasectomy  Other Topics Concern  . 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 amLODipine (NORVASC) 5 MG tablet Take 1 tablet (5 mg total) by mouth daily.  . cholecalciferol (VITAMIN D) 1000 UNITS tablet Take 2 Units by mouth daily.  . hydrochlorothiazide (HYDRODIURIL) 25 MG tablet  Take 0.5 tablets (12.5 mg total) by mouth daily. In the am  . levothyroxine (SYNTHROID) 88 MCG tablet Take 1 tablet (88 mcg total) by mouth daily before breakfast. 30 minutes before breakfast  . sertraline (ZOLOFT) 50 MG tablet Take 1 tablet (50 mg total) by mouth daily. appt further refills     ROS:  Review of Systems  Constitutional: Negative for fatigue, fever and unexpected weight change.  Respiratory: Negative for cough, shortness of breath and wheezing.   Cardiovascular: Negative for chest pain, palpitations and leg swelling.  Gastrointestinal: Negative for blood in stool, constipation, diarrhea, nausea and vomiting.  Endocrine: Negative for cold intolerance, heat intolerance and polyuria.  Genitourinary: Negative for dyspareunia, dysuria, flank pain, frequency, genital sores, hematuria, menstrual problem, pelvic pain, urgency, vaginal bleeding, vaginal discharge and vaginal pain.  Musculoskeletal: Negative for back pain, joint swelling and myalgias.  Skin: Negative for rash.  Neurological: Negative for dizziness, syncope, light-headedness, numbness and headaches.  Hematological: Negative for adenopathy.  Psychiatric/Behavioral: Negative for agitation, confusion, sleep disturbance and suicidal ideas. The patient is not nervous/anxious.      Objective: BP 108/80   Ht 5\' 2"  (1.575 m)   Wt 191 lb (86.6 kg)   LMP 07/04/2020 (Exact Date)   BMI 34.93 kg/m    Physical Exam Constitutional:      Appearance: She is well-developed.  Genitourinary:     Vulva normal.     Genitourinary Comments: NEG EXT VULVAR EXAM FOR ITCH     Right Labia: No rash, tenderness or lesions.    Left Labia: No tenderness, lesions or rash.    No vaginal discharge, erythema or tenderness.      Right Adnexa: not tender and no mass present.    Left Adnexa: not tender and no mass present.    No cervical friability or polyp.     Uterus is not enlarged or tender.  Breasts:     Right: No mass, nipple  discharge, skin change or tenderness.     Left: No mass, nipple discharge, skin change or tenderness.    Neck:     Thyroid: No thyromegaly.  Cardiovascular:     Rate and Rhythm: Normal rate and regular rhythm.     Heart sounds: Normal heart sounds. No murmur heard.   Pulmonary:     Effort: Pulmonary effort is normal.     Breath sounds: Normal breath sounds.  Abdominal:     Palpations: Abdomen is soft.     Tenderness: There is no abdominal tenderness. There is no guarding or rebound.  Musculoskeletal:  General: Normal range of motion.     Cervical back: Normal range of motion.  Lymphadenopathy:     Cervical: No cervical adenopathy.  Neurological:     General: No focal deficit present.     Mental Status: She is alert and oriented to person, place, and time.     Cranial Nerves: No cranial nerve deficit.  Skin:    General: Skin is warm and dry.  Psychiatric:        Mood and Affect: Mood normal.        Behavior: Behavior normal.        Thought Content: Thought content normal.        Judgment: Judgment normal.  Vitals reviewed.     Assessment/Plan: Encounter for annual routine gynecological examination  Encounter for screening mammogram for malignant neoplasm of breast; pt current on mammo   GYN counsel adequate intake of calcium and vitamin D, diet and exercise     F/U  Return in about 1 year (around 07/18/2021).  Hala Narula B. Brandun Pinn, PA-C 07/18/2020 3:48 PM

## 2020-10-09 ENCOUNTER — Other Ambulatory Visit: Payer: Self-pay

## 2020-10-09 ENCOUNTER — Other Ambulatory Visit (INDEPENDENT_AMBULATORY_CARE_PROVIDER_SITE_OTHER): Payer: Federal, State, Local not specified - PPO

## 2020-10-09 DIAGNOSIS — Z1389 Encounter for screening for other disorder: Secondary | ICD-10-CM

## 2020-10-09 DIAGNOSIS — E785 Hyperlipidemia, unspecified: Secondary | ICD-10-CM

## 2020-10-09 DIAGNOSIS — E039 Hypothyroidism, unspecified: Secondary | ICD-10-CM | POA: Diagnosis not present

## 2020-10-09 DIAGNOSIS — I10 Essential (primary) hypertension: Secondary | ICD-10-CM

## 2020-10-09 LAB — COMPREHENSIVE METABOLIC PANEL
ALT: 16 U/L (ref 0–35)
AST: 19 U/L (ref 0–37)
Albumin: 4.5 g/dL (ref 3.5–5.2)
Alkaline Phosphatase: 74 U/L (ref 39–117)
BUN: 14 mg/dL (ref 6–23)
CO2: 31 mEq/L (ref 19–32)
Calcium: 9.7 mg/dL (ref 8.4–10.5)
Chloride: 101 mEq/L (ref 96–112)
Creatinine, Ser: 0.8 mg/dL (ref 0.40–1.20)
GFR: 90.92 mL/min (ref >60.00)
Glucose, Bld: 88 mg/dL (ref 70–99)
Potassium: 4.1 mEq/L (ref 3.5–5.1)
Sodium: 139 mEq/L (ref 135–145)
Total Bilirubin: 0.5 mg/dL (ref 0.2–1.2)
Total Protein: 7 g/dL (ref 6.0–8.3)

## 2020-10-09 LAB — CBC WITH DIFFERENTIAL/PLATELET
Basophils Absolute: 0.1 10*3/uL (ref 0.0–0.1)
Basophils Relative: 1 % (ref 0.0–3.0)
Eosinophils Absolute: 0.1 10*3/uL (ref 0.0–0.7)
Eosinophils Relative: 1.8 % (ref 0.0–5.0)
HCT: 43.2 % (ref 36.0–46.0)
Hemoglobin: 14.7 g/dL (ref 12.0–15.0)
Lymphocytes Relative: 45.9 % (ref 12.0–46.0)
Lymphs Abs: 2.6 10*3/uL (ref 0.7–4.0)
MCHC: 33.9 g/dL (ref 30.0–36.0)
MCV: 89 fl (ref 78.0–100.0)
Monocytes Absolute: 0.4 10*3/uL (ref 0.1–1.0)
Monocytes Relative: 6.4 % (ref 3.0–12.0)
Neutro Abs: 2.5 10*3/uL (ref 1.4–7.7)
Neutrophils Relative %: 44.9 % (ref 43.0–77.0)
Platelets: 260 10*3/uL (ref 150.0–400.0)
RBC: 4.86 Mil/uL (ref 3.87–5.11)
RDW: 13.7 % (ref 11.5–15.5)
WBC: 5.6 10*3/uL (ref 4.0–10.5)

## 2020-10-09 LAB — TSH: TSH: 2.19 u[IU]/mL (ref 0.35–4.50)

## 2020-10-09 LAB — LIPID PANEL
Cholesterol: 208 mg/dL — ABNORMAL HIGH (ref 0–200)
HDL: 58.3 mg/dL (ref >39.00)
LDL Cholesterol: 119 mg/dL — ABNORMAL HIGH (ref 0–99)
NonHDL: 149.85
Total CHOL/HDL Ratio: 4
Triglycerides: 156 mg/dL — ABNORMAL HIGH (ref 0.0–149.0)
VLDL: 31.2 mg/dL (ref 0.0–40.0)

## 2020-10-10 LAB — URINALYSIS, ROUTINE W REFLEX MICROSCOPIC
Bilirubin Urine: NEGATIVE
Glucose, UA: NEGATIVE
Hgb urine dipstick: NEGATIVE
Ketones, ur: NEGATIVE
Leukocytes,Ua: NEGATIVE
Nitrite: NEGATIVE
Protein, ur: NEGATIVE
Specific Gravity, Urine: 1.007 (ref 1.001–1.035)
pH: 7.5 (ref 5.0–8.0)

## 2020-10-11 ENCOUNTER — Encounter: Payer: Self-pay | Admitting: Internal Medicine

## 2020-10-11 NOTE — Telephone Encounter (Signed)
Please advise, would this need to be filled out by the mother's doctor as they are managing the care Patient will need to be out for?

## 2020-10-16 ENCOUNTER — Other Ambulatory Visit: Payer: Federal, State, Local not specified - PPO

## 2020-10-18 ENCOUNTER — Encounter: Payer: Federal, State, Local not specified - PPO | Admitting: Internal Medicine

## 2020-10-19 DIAGNOSIS — Z0279 Encounter for issue of other medical certificate: Secondary | ICD-10-CM

## 2020-11-09 ENCOUNTER — Encounter: Payer: Self-pay | Admitting: Internal Medicine

## 2020-11-10 ENCOUNTER — Encounter: Payer: Self-pay | Admitting: Internal Medicine

## 2020-11-10 ENCOUNTER — Other Ambulatory Visit: Payer: Self-pay

## 2020-11-10 ENCOUNTER — Ambulatory Visit: Payer: Federal, State, Local not specified - PPO | Admitting: Internal Medicine

## 2020-11-10 ENCOUNTER — Other Ambulatory Visit (HOSPITAL_COMMUNITY)
Admission: RE | Admit: 2020-11-10 | Discharge: 2020-11-10 | Disposition: A | Discharge status: Home / Self Care | Payer: Federal, State, Local not specified - PPO | Source: Ambulatory Visit | Attending: Internal Medicine | Admitting: Internal Medicine

## 2020-11-10 VITALS — BP 100/62 | HR 83 | Temp 98.4°F | Ht 62.0 in | Wt 188.2 lb

## 2020-11-10 DIAGNOSIS — N888 Other specified noninflammatory disorders of cervix uteri: Secondary | ICD-10-CM | POA: Diagnosis not present

## 2020-11-10 DIAGNOSIS — N760 Acute vaginitis: Secondary | ICD-10-CM

## 2020-11-10 DIAGNOSIS — Z1231 Encounter for screening mammogram for malignant neoplasm of breast: Secondary | ICD-10-CM

## 2020-11-10 DIAGNOSIS — R319 Hematuria, unspecified: Secondary | ICD-10-CM

## 2020-11-10 NOTE — Progress Notes (Signed)
Chief Complaint  Patient presents with   Hematuria   F/u  1. Seeing pink on toilet paper x 2-3 days and no pain, burning,itching FH in mom uterine cancer and normally starts cycle Q21-28 days but irregular now not taking ocp since last summer and partner had vasectomy. Cycles are lighter. She is due for cycle in 7 days and blood in darker in color, normally has bowel movement before cycles but not this time and had a clot the size of dime or less last night wore liner and no blood this am  FH mom endometrial cancer but not genetic   Review of Systems  Constitutional:  Negative for weight loss.  HENT:  Negative for hearing loss.   Eyes:  Negative for blurred vision.  Respiratory:  Negative for shortness of breath.   Cardiovascular:  Negative for chest pain.  Genitourinary:        Vaginal spotting, irregular cycle   Skin:  Negative for rash.  Past Medical History:  Diagnosis Date   Anxiety    BV (bacterial vaginosis)    Depression    History of chicken pox    Hypertension    Hypothyroid    h/o abnormal elevated tsh    MVP (mitral valve prolapse) 2000   resolved 2005   Ovarian cyst    Vitamin D deficiency    Past Surgical History:  Procedure Laterality Date   CYSTOSCOPY     childhood   REDUCTION MAMMAPLASTY Bilateral 08/2015   wisdom teeth removal     Family History  Problem Relation Age of Onset   Hypertension Mother    Arthritis Mother    Depression Mother    Hyperlipidemia Mother    40 / [place] Mother    Thyroid disease Mother    Uterine cancer Mother        stage 3 dx'ed age 29 s/p hysterectomy/chemo; neg genetic testing   Breast cancer Paternal Grandmother 48   Cancer Paternal Grandmother        late 37s    Diabetes type II Maternal Grandmother    Arthritis Maternal Grandmother    Depression Maternal Grandmother    Diabetes Maternal Grandmother    Hyperlipidemia Maternal Grandmother    Hypertension Maternal Grandmother    Intellectual  disability Maternal Grandmother    Arthritis Father    Hypertension Father    Hyperlipidemia Maternal Grandfather    Cancer Maternal Grandfather        SCC mohs    Diabetes Paternal Grandfather    Heart disease Paternal Grandfather    Social History   Socioeconomic History   Marital status: Divorced    Spouse name: Not on file   Number of children: Not on file   Years of education: Not on file   Highest education level: Not on file  Occupational History   Not on file  Tobacco Use   Smoking status: Never   Smokeless tobacco: Never  Vaping Use   Vaping Use: Never used  Substance and Sexual Activity   Alcohol use: No   Drug use: No   Sexual activity: Yes    Birth control/protection: None, Surgical    Comment: Vasectomy  Other Topics Concern   Not on file  Social History Narrative   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   cholecalciferol (VITAMIN D) 1000 UNITS tablet Take 2 Units by mouth daily.   hydrochlorothiazide (HYDRODIURIL) 25 MG tablet Take 0.5 tablets (12.5 mg total) by mouth daily. In the am   levothyroxine (SYNTHROID) 88 MCG tablet Take 1 tablet (88 mcg total) by mouth daily before breakfast. 30 minutes before breakfast   sertraline (ZOLOFT) 50 MG tablet Take 1 tablet (50 mg total) by mouth daily. appt further refills   Allergies  Allergen Reactions   Penicillins Hives   Recent Results (from the past 2160 hour(s))  Urinalysis, Routine w reflex microscopic     Status: None    Collection Time: 10/09/20  9:08 AM  Result Value Ref Range   Color, Urine YELLOW YELLOW   APPearance CLEAR CLEAR   Specific Gravity, Urine 1.007 1.001 - 1.035   pH 7.5 5.0 - 8.0   Glucose, UA NEGATIVE NEGATIVE   Bilirubin Urine NEGATIVE NEGATIVE   Ketones, ur NEGATIVE NEGATIVE   Hgb urine dipstick NEGATIVE NEGATIVE   Protein, ur NEGATIVE NEGATIVE   Nitrite NEGATIVE NEGATIVE   Leukocytes,Ua NEGATIVE NEGATIVE  TSH     Status: None   Collection Time: 10/09/20  9:08 AM  Result Value Ref Range   TSH 2.19 0.35 - 4.50 uIU/mL  CBC with Differential/Platelet     Status: None   Collection Time: 10/09/20  9:08 AM  Result Value Ref Range   WBC 5.6 4.0 - 10.5 K/uL   RBC 4.86 3.87 - 5.11 Mil/uL   Hemoglobin 14.7 12.0 - 15.0 g/dL   HCT 43.2 36.0 - 46.0 %   MCV 89.0 78.0 - 100.0 fl   MCHC 33.9 30.0 - 36.0 g/dL   RDW 13.7 11.5 - 15.5 %   Platelets 260.0 150.0 - 400.0 K/uL   Neutrophils Relative % 44.9 43.0 - 77.0 %   Lymphocytes Relative 45.9 12.0 - 46.0 %   Monocytes Relative 6.4 3.0 - 12.0 %   Eosinophils Relative 1.8 0.0 - 5.0 %   Basophils Relative 1.0 0.0 - 3.0 %   Neutro Abs 2.5 1.4 - 7.7 K/uL   Lymphs Abs 2.6 0.7 - 4.0 K/uL   Monocytes Absolute 0.4 0.1 - 1.0 K/uL   Eosinophils Absolute 0.1 0.0 - 0.7 K/uL   Basophils Absolute 0.1 0.0 - 0.1 K/uL  Lipid panel     Status: Abnormal   Collection Time: 10/09/20  9:08 AM  Result Value Ref Range   Cholesterol 208 (H) 0 - 200 mg/dL    Comment: ATP III Classification       Desirable:  < 200 mg/dL               Borderline High:  200 - 239 mg/dL          High:  > = 240 mg/dL   Triglycerides 156.0 (H) 0.0 - 149.0 mg/dL    Comment: Normal:  <150 mg/dLBorderline High:  150 - 199 mg/dL   HDL 58.30 >39.00 mg/dL   VLDL 31.2 0.0 - 40.0 mg/dL   LDL Cholesterol 119 (H) 0 - 99 mg/dL   Total  CHOL/HDL Ratio 4     Comment:                Men          Women1/2 Average Risk     3.4          3.3Average Risk          5.0          4.42X Average Risk           9.6          7.13X Average Risk          15.0          11.0                       NonHDL 149.85     Comment: NOTE:  Non-HDL goal should be 30 mg/dL higher than patient's LDL goal (i.e. LDL goal of < 70 mg/dL, would have non-HDL goal of < 100 mg/dL)  Comprehensive metabolic panel     Status: None   Collection Time: 10/09/20  9:08 AM  Result Value Ref Range   Sodium 139 135 - 145 mEq/L   Potassium 4.1 3.5 - 5.1 mEq/L   Chloride 101 96 - 112 mEq/L   CO2 31 19 - 32 mEq/L   Glucose, Bld 88 70 - 99 mg/dL   BUN 14 6 - 23 mg/dL   Creatinine, Ser 0.80 0.40 - 1.20 mg/dL   Total Bilirubin 0.5 0.2 - 1.2 mg/dL   Alkaline Phosphatase 74 39 - 117 U/L   AST 19 0 - 37 U/L   ALT 16 0 - 35 U/L   Total Protein 7.0 6.0 - 8.3 g/dL   Albumin 4.5 3.5 - 5.2 g/dL   GFR 90.92 >60.00 mL/min    Comment: Calculated using the CKD-EPI Creatinine Equation (2021)   Calcium 9.7 8.4 - 10.5 mg/dL   Objective  Body mass index is 34.42 kg/m. Wt Readings from Last 3 Encounters:  11/10/20 188 lb 3.2 oz (85.4 kg)  07/18/20 191 lb (86.6 kg)  04/21/20 187 lb 12.8 oz (85.2 kg)   Temp Readings from Last 3 Encounters:  11/10/20 98.4 F (36.9 C) (Oral)  04/21/20 97.9 F (36.6 C) (Oral)  01/14/20 98.7 F (37.1 C)   BP Readings from Last 3 Encounters:  11/10/20 100/62  07/18/20 108/80  04/21/20 120/80   Pulse Readings from Last 3 Encounters:  11/10/20 83  04/21/20 70  01/14/20 83    Physical Exam Vitals and nursing note reviewed. Exam conducted with a chaperone present.  Constitutional:      Appearance: Normal appearance. She is well-developed and well-groomed. She is obese.  HENT:     Head: Normocephalic and atraumatic.  Cardiovascular:     Rate and Rhythm: Normal rate and regular rhythm.     Heart sounds: Normal heart sounds. No murmur heard. Pulmonary:     Effort: Pulmonary effort is normal.     Breath sounds: Normal breath sounds.  Genitourinary:    Exam position: Supine.     Pubic Area: No rash.       Labia:        Right: No rash.        Left: No rash.      Vagina: Normal.     Cervix: Discharge and cervical bleeding present.     Uterus: Normal.      Adnexa: Right adnexa normal and left adnexa normal.  Skin:    General: Skin is warm and dry.  Neurological:     General: No focal deficit present.     Mental Status: She is alert and oriented to person, place, and time. Mental status is at baseline.     Gait: Gait normal.  Psychiatric:        Attention and Perception: Attention and perception normal.        Mood and Affect: Mood and affect normal.        Speech: Speech normal.        Behavior: Behavior normal. Behavior is cooperative.        Thought Content: Thought content normal.        Cognition and Memory: Cognition and memory normal.        Judgment: Judgment normal.    Assessment  Plan  Bleeding of cervix - Plan: Cervicovaginal ancillary only( [place]), Ambulatory referral to Obstetrics / [facility]ne[facility] Needs w/o ob/gyn   Hematuria, unspecified type - Plan: Urinalysis, Routine w reflex microscopic, Urine Culture  Acute vaginitis - Plan: Cervicovaginal ancillary only( [place]), Ambulatory referral to Obstetrics / Gynecology   HM Flu shot had work [place] 02/26/19  covid 3/3 consider 4th dose  Tdap utd  Hep B new vx 2/2 immune MMR immune  mammogram 05/15/18 negative, 05/21/2019 neg order in sch 05/22/20 ordered    Colonoscopy age 15    Pap 05/18/18 neg HPV neg pap [facility] 05/24/19 FH uterine cancer mom  Labs 04/03/2020  [facility] years prior referral derm appt 02/2020 no issues removal benign lesions x 4 healed  Cont healthy diet and exercise  Provider: Dr. Olivia Mackie McLean-Scocuzza-Internal Medicine

## 2020-11-11 LAB — URINE CULTURE
MICRO NUMBER:: 11993970
SPECIMEN QUALITY:: ADEQUATE

## 2020-11-11 LAB — URINALYSIS, ROUTINE W REFLEX MICROSCOPIC
Bacteria, UA: NONE SEEN /HPF
Bilirubin Urine: NEGATIVE
Glucose, UA: NEGATIVE
Hyaline Cast: NONE SEEN /LPF
Ketones, ur: NEGATIVE
Nitrite: NEGATIVE
Protein, ur: NEGATIVE
RBC / HPF: NONE SEEN /HPF (ref 0–2)
Specific Gravity, Urine: 1.021 (ref 1.001–1.035)
Squamous Epithelial / HPF: NONE SEEN /HPF (ref <=5)
pH: 5.5 (ref 5.0–8.0)

## 2020-11-11 LAB — MICROSCOPIC MESSAGE

## 2020-11-12 LAB — CERVICOVAGINAL ANCILLARY ONLY
Bacterial Vaginitis (gardnerella): POSITIVE — AB
Candida Glabrata: NEGATIVE
Candida Vaginitis: NEGATIVE
Chlamydia: NEGATIVE
Comment: NEGATIVE
Comment: NEGATIVE
Comment: NEGATIVE
Comment: NEGATIVE
Comment: NEGATIVE
Comment: NORMAL
Neisseria Gonorrhea: NEGATIVE
Trichomonas: NEGATIVE

## 2020-11-14 ENCOUNTER — Encounter: Payer: Self-pay | Admitting: Internal Medicine

## 2020-11-14 ENCOUNTER — Other Ambulatory Visit: Payer: Self-pay | Admitting: Internal Medicine

## 2020-11-14 DIAGNOSIS — N760 Acute vaginitis: Secondary | ICD-10-CM

## 2020-11-14 DIAGNOSIS — I10 Essential (primary) hypertension: Secondary | ICD-10-CM

## 2020-11-14 DIAGNOSIS — B9689 Other specified bacterial agents as the cause of diseases classified elsewhere: Secondary | ICD-10-CM

## 2020-11-14 MED ORDER — METRONIDAZOLE 500 MG PO TABS: 500.0000 mg | ORAL_TABLET | Freq: Two times a day (BID) | ORAL | 0 refills | Status: DC

## 2020-11-14 MED ORDER — METRONIDAZOLE 0.75 % VA GEL: 1.0000 | Freq: Every day | VAGINAL | 0 refills | Status: DC

## 2020-11-21 ENCOUNTER — Encounter: Payer: Self-pay | Admitting: Obstetrics and Gynecology

## 2020-11-21 ENCOUNTER — Other Ambulatory Visit (HOSPITAL_COMMUNITY)
Admission: RE | Admit: 2020-11-21 | Discharge: 2020-11-21 | Disposition: A | Discharge status: Home / Self Care | Payer: Federal, State, Local not specified - PPO | Source: Ambulatory Visit | Attending: Obstetrics and Gynecology | Admitting: Obstetrics and Gynecology

## 2020-11-21 ENCOUNTER — Other Ambulatory Visit: Payer: Self-pay

## 2020-11-21 ENCOUNTER — Ambulatory Visit: Payer: Federal, State, Local not specified - PPO | Admitting: Obstetrics and Gynecology

## 2020-11-21 VITALS — BP 110/80 | Ht 62.0 in | Wt 192.0 lb

## 2020-11-21 DIAGNOSIS — N939 Abnormal uterine and vaginal bleeding, unspecified: Secondary | ICD-10-CM | POA: Diagnosis not present

## 2020-11-21 DIAGNOSIS — Z124 Encounter for screening for malignant neoplasm of cervix: Secondary | ICD-10-CM | POA: Diagnosis not present

## 2020-11-21 DIAGNOSIS — Z8049 Family history of malignant neoplasm of other genital organs: Secondary | ICD-10-CM

## 2020-11-21 DIAGNOSIS — Z1151 Encounter for screening for human papillomavirus (HPV): Secondary | ICD-10-CM

## 2020-11-21 NOTE — Progress Notes (Signed)
McLean-Scocuzza, Nino Glow, MD   Chief Complaint  Patient presents with   Vaginal Bleeding    No abnormal pain    HPI:      Ms. Kaitlin Strickland is a 45 y.o. G1P1001 whose LMP was Patient's last menstrual period was 11/07/2020 (approximate)., presents today for 1 episode AUB this cycle, referred by PCP. Menses are usually every 4 wks, lasting 3-4 days light flow, no BTB. This month, period started about 10 days early, was light for several days and then stopped; no unusual pain. Noticed blood with wiping mostly so concerned about UTI (hx of UTIs in past). Saw PCP and had neg C&S. Bleeding confirmed coming from cx. Neg STD testing 6/22; normal thyroid 5/22, Hx of hypothyroidism.05/18/18 neg pap/ neg HPV. She is sex active, s/p vasectomy. No new partners. No sickness/wt changes. Pt denies any increased increased stress but her mom has had recent endometrial cancer mets and has had to restart chemo. Pt going [facility] and forth [facility] for her mom. Her mom is gene panel neg.   Past Medical History:  Diagnosis Date   Anxiety    BV (bacterial vaginosis)    Depression    History of chicken pox    Hypertension    Hypothyroid    h/o abnormal elevated tsh    MVP (mitral valve prolapse) 2000   resolved 2005   Ovarian cyst    Vitamin D deficiency     Past Surgical History:  Procedure Laterality Date   CYSTOSCOPY     childhood   REDUCTION MAMMAPLASTY Bilateral 08/2015   wisdom teeth removal      Family History  Problem Relation Age of Onset   Cancer Mother        uterine   Hypertension Mother    Arthritis Mother    Depression Mother    Hyperlipidemia Mother    Miscarriages / [place] Mother    Thyroid disease Mother    Uterine cancer Mother        stage 3 dx'ed age 83 s/p hysterectomy/chemo; neg genetic testing   Arthritis Father    Hypertension Father    Diabetes type II Maternal Grandmother    Arthritis Maternal Grandmother    Depression Maternal Grandmother    Diabetes Maternal  Grandmother    Hyperlipidemia Maternal Grandmother    Hypertension Maternal Grandmother    Intellectual disability Maternal Grandmother    Hyperlipidemia Maternal Grandfather    Cancer Maternal Grandfather        SCC mohs    Breast cancer Paternal Grandmother 14   Cancer Paternal Grandmother        late 40s    Diabetes Paternal Grandfather    Heart disease Paternal Grandfather     Social History   Socioeconomic History   Marital status: Divorced    Spouse name: Not on file   Number of children: Not on file   Years of education: Not on file   Highest education level: Not on file  Occupational History   Not on file  Tobacco Use   Smoking status: Never   Smokeless tobacco: Never  Vaping Use   Vaping Use: Never used  Substance and Sexual Activity   Alcohol use: No   Drug use: No   Sexual activity: Yes    Birth control/protection: None, Surgical    Comment: Vasectomy  Other Topics Concern   Not on file  Social History Narrative   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     hydrochlorothiazide (HYDRODIURIL) 25 MG tablet Take 0.5 tablets (12.5 mg total) by mouth daily. In the am 90 tablet 3   levothyroxine (SYNTHROID) 88 MCG tablet Take 1 tablet (88 mcg total) by mouth daily before breakfast. 30 minutes before breakfast 90 tablet 3   metroNIDAZOLE (FLAGYL) 500 MG tablet  Take 1 tablet (500 mg total) by mouth 2 (two) times daily. With food 14 tablet 0   sertraline (ZOLOFT) 50 MG tablet Take 1 tablet (50 mg total) by mouth daily. appt further refills 90 tablet 3   metroNIDAZOLE (METROGEL) 0.75 % vaginal gel Place 1 Applicatorful vaginally at bedtime. Of 5 grams X 5 days (Patient not taking: Reported on 11/21/2020) 70 g 0   No facility-administered medications prior to visit.      ROS:  Review of Systems  Constitutional:  Negative for fever.  Gastrointestinal:  Negative for blood in stool, constipation, diarrhea, nausea and vomiting.  Genitourinary:  Positive for menstrual problem. Negative for dyspareunia, dysuria, flank pain, frequency, hematuria, urgency, vaginal bleeding, vaginal discharge and vaginal pain.  Musculoskeletal:  Negative for back pain.  Skin:  Negative for rash.  BREAST: No symptoms   OBJECTIVE:   Vitals:  BP 110/80   Ht 5\' 2"  (1.575 m)   Wt 192 lb (87.1 kg)   LMP 11/07/2020 (Approximate)   BMI 35.12 kg/m   Physical Exam Vitals reviewed.  Constitutional:      Appearance: She is well-developed.  Pulmonary:     Effort: Pulmonary effort is normal.  Genitourinary:    General: Normal vulva.     Pubic Area: No rash.      Labia:        Right: No rash, tenderness or lesion.        Left: No rash, tenderness or lesion.      Vagina: Normal. No vaginal discharge, erythema, tenderness or bleeding.     Cervix: Normal.     Uterus: Normal. Not enlarged and not tender.      Adnexa: Right adnexa normal and left adnexa normal.       Right: No mass or tenderness.         Left: No mass or tenderness.       Comments: BLEEDING RESOLVED Musculoskeletal:        General: Normal range of motion.     Cervical back: Normal range of motion.  Skin:    General: Skin is warm and dry.  Neurological:     General: No focal deficit present.     Mental Status: She is alert and oriented to person, place, and time.  Psychiatric:        Mood and  Affect: Mood normal.        Behavior: Behavior normal.  Thought Content: Thought content normal.        Judgment: Judgment normal.    Assessment/Plan: Abnormal uterine bleeding (AUB) - Plan: Cytology - PAP; 1 episode of period coming early, still light flow; sx resolved. Neg STD testing/normal TSH with PCP. Check pap although will probably be normal. Pt under increased stress. F/u if sx persist for GYN u/s. If returns to normal, reassurance. F/u prn.   Cervical cancer screening - Plan: Cytology - PAP  Screening for HPV (human papillomavirus) - Plan: Cytology - PAP  Family history of uterine cancer    Return if symptoms worsen or fail to improve.  Jamorion Gomillion B. Tiffanyann Deroo, PA-C 11/21/2020 2:40 PM

## 2020-11-24 ENCOUNTER — Encounter: Payer: Self-pay | Admitting: Obstetrics and Gynecology

## 2020-11-24 LAB — CYTOLOGY - PAP
Comment: NEGATIVE
Diagnosis: UNDETERMINED — AB
High risk HPV: POSITIVE — AB

## 2021-01-19 ENCOUNTER — Encounter: Payer: Self-pay | Admitting: Internal Medicine

## 2021-02-14 ENCOUNTER — Other Ambulatory Visit: Payer: Self-pay | Admitting: Internal Medicine

## 2021-02-14 DIAGNOSIS — I10 Essential (primary) hypertension: Secondary | ICD-10-CM

## 2021-03-19 ENCOUNTER — Other Ambulatory Visit: Payer: Self-pay | Admitting: Internal Medicine

## 2021-03-19 DIAGNOSIS — E039 Hypothyroidism, unspecified: Secondary | ICD-10-CM

## 2021-04-24 ENCOUNTER — Other Ambulatory Visit: Payer: Self-pay

## 2021-04-24 ENCOUNTER — Encounter: Payer: Self-pay | Admitting: Internal Medicine

## 2021-04-24 ENCOUNTER — Ambulatory Visit (INDEPENDENT_AMBULATORY_CARE_PROVIDER_SITE_OTHER): Payer: Federal, State, Local not specified - PPO | Admitting: Internal Medicine

## 2021-04-24 VITALS — BP 118/80 | HR 73 | Temp 97.0°F | Ht 62.99 in | Wt 191.8 lb

## 2021-04-24 DIAGNOSIS — I10 Essential (primary) hypertension: Secondary | ICD-10-CM

## 2021-04-24 DIAGNOSIS — Z0000 Encounter for general adult medical examination without abnormal findings: Secondary | ICD-10-CM

## 2021-04-24 DIAGNOSIS — K921 Melena: Secondary | ICD-10-CM

## 2021-04-24 DIAGNOSIS — F419 Anxiety disorder, unspecified: Secondary | ICD-10-CM

## 2021-04-24 DIAGNOSIS — N83202 Unspecified ovarian cyst, left side: Secondary | ICD-10-CM

## 2021-04-24 DIAGNOSIS — Z1211 Encounter for screening for malignant neoplasm of colon: Secondary | ICD-10-CM

## 2021-04-24 DIAGNOSIS — E039 Hypothyroidism, unspecified: Secondary | ICD-10-CM

## 2021-04-24 DIAGNOSIS — R319 Hematuria, unspecified: Secondary | ICD-10-CM

## 2021-04-24 DIAGNOSIS — E785 Hyperlipidemia, unspecified: Secondary | ICD-10-CM

## 2021-04-24 DIAGNOSIS — N926 Irregular menstruation, unspecified: Secondary | ICD-10-CM

## 2021-04-24 DIAGNOSIS — B977 Papillomavirus as the cause of diseases classified elsewhere: Secondary | ICD-10-CM | POA: Diagnosis not present

## 2021-04-24 DIAGNOSIS — N83201 Unspecified ovarian cyst, right side: Secondary | ICD-10-CM

## 2021-04-24 DIAGNOSIS — R102 Pelvic and perineal pain: Secondary | ICD-10-CM

## 2021-04-24 LAB — COMPREHENSIVE METABOLIC PANEL
ALT: 14 U/L (ref 0–35)
AST: 16 U/L (ref 0–37)
Albumin: 4.5 g/dL (ref 3.5–5.2)
Alkaline Phosphatase: 70 U/L (ref 39–117)
BUN: 17 mg/dL (ref 6–23)
CO2: 30 mEq/L (ref 19–32)
Calcium: 9.5 mg/dL (ref 8.4–10.5)
Chloride: 101 mEq/L (ref 96–112)
Creatinine, Ser: 0.81 mg/dL (ref 0.40–1.20)
GFR: 89.24 mL/min (ref >60.00)
Glucose, Bld: 84 mg/dL (ref 70–99)
Potassium: 4.6 mEq/L (ref 3.5–5.1)
Sodium: 137 mEq/L (ref 135–145)
Total Bilirubin: 0.5 mg/dL (ref 0.2–1.2)
Total Protein: 6.9 g/dL (ref 6.0–8.3)

## 2021-04-24 LAB — URINALYSIS, ROUTINE W REFLEX MICROSCOPIC
Bilirubin Urine: NEGATIVE
Hgb urine dipstick: NEGATIVE
Ketones, ur: NEGATIVE
Nitrite: NEGATIVE
Specific Gravity, Urine: 1.02 (ref 1.000–1.030)
Total Protein, Urine: NEGATIVE
Urine Glucose: NEGATIVE
Urobilinogen, UA: 0.2 (ref 0.0–1.0)
pH: 6.5 (ref 5.0–8.0)

## 2021-04-24 LAB — CBC WITH DIFFERENTIAL/PLATELET
Basophils Absolute: 0.1 10*3/uL (ref 0.0–0.1)
Basophils Relative: 1.1 % (ref 0.0–3.0)
Eosinophils Absolute: 0.1 10*3/uL (ref 0.0–0.7)
Eosinophils Relative: 2.2 % (ref 0.0–5.0)
HCT: 44.5 % (ref 36.0–46.0)
Hemoglobin: 14.8 g/dL (ref 12.0–15.0)
Lymphocytes Relative: 37.4 % (ref 12.0–46.0)
Lymphs Abs: 2.4 10*3/uL (ref 0.7–4.0)
MCHC: 33.3 g/dL (ref 30.0–36.0)
MCV: 88.9 fl (ref 78.0–100.0)
Monocytes Absolute: 0.4 10*3/uL (ref 0.1–1.0)
Monocytes Relative: 6.1 % (ref 3.0–12.0)
Neutro Abs: 3.4 10*3/uL (ref 1.4–7.7)
Neutrophils Relative %: 53.2 % (ref 43.0–77.0)
Platelets: 255 10*3/uL (ref 150.0–400.0)
RBC: 5.01 Mil/uL (ref 3.87–5.11)
RDW: 13.4 % (ref 11.5–15.5)
WBC: 6.4 10*3/uL (ref 4.0–10.5)

## 2021-04-24 LAB — LIPID PANEL
Cholesterol: 219 mg/dL — ABNORMAL HIGH (ref 0–200)
HDL: 60 mg/dL (ref >39.00)
LDL Cholesterol: 129 mg/dL — ABNORMAL HIGH (ref 0–99)
NonHDL: 159.35
Total CHOL/HDL Ratio: 4
Triglycerides: 150 mg/dL — ABNORMAL HIGH (ref 0.0–149.0)
VLDL: 30 mg/dL (ref 0.0–40.0)

## 2021-04-24 LAB — TSH: TSH: 2.74 u[IU]/mL (ref 0.35–5.50)

## 2021-04-24 MED ORDER — SERTRALINE HCL 50 MG PO TABS: 50.0000 mg | ORAL_TABLET | Freq: Every day | ORAL | 3 refills | Status: DC

## 2021-04-24 NOTE — Progress Notes (Signed)
Chief Complaint  Patient presents with   Annual Exam   Annual  1. Htn controlled on hctz 12.5 norvasc 5 mg qd controlled 2. C/o RLQ ab pain and blood in stool h/o ovarian cysts not sure if coming GU/GI and had loose stools w/o correlation to cycle like normal    Review of Systems  Constitutional:  Negative for weight loss.  HENT:  Negative for hearing loss.   Eyes:  Negative for blurred vision.  Respiratory:  Negative for shortness of breath.   Cardiovascular:  Negative for chest pain.  Gastrointestinal:  Negative for abdominal pain and blood in stool.  Genitourinary:  Negative for dysuria.  Musculoskeletal:  Negative for falls and joint pain.  Skin:  Negative for rash.  Neurological:  Negative for headaches.  Psychiatric/Behavioral:  Negative for depression.   Past Medical History:  Diagnosis Date   Anxiety    BV (bacterial vaginosis)    Depression    History of chicken pox    Hypertension    Hypothyroid    h/o abnormal elevated tsh    MVP (mitral valve prolapse) 2000   resolved 2005   Ovarian cyst    Vitamin D deficiency    Past Surgical History:  Procedure Laterality Date   CYSTOSCOPY     childhood   REDUCTION MAMMAPLASTY Bilateral 08/2015   wisdom teeth removal     Family History  Problem Relation Age of Onset   Cancer Mother        uterine   Hypertension Mother    Arthritis Mother    Depression Mother    Hyperlipidemia Mother    Miscarriages / [place] Mother    Thyroid disease Mother    Uterine cancer Mother        stage 3 dx'ed age 32 s/p hysterectomy/chemo; neg genetic testing   Arthritis Father    Hypertension Father    Diabetes type II Maternal Grandmother    Arthritis Maternal Grandmother    Depression Maternal Grandmother    Diabetes Maternal Grandmother    Hyperlipidemia Maternal Grandmother    Hypertension Maternal Grandmother    Intellectual disability Maternal Grandmother    Hyperlipidemia Maternal Grandfather    Cancer Maternal  Grandfather        SCC mohs    Breast cancer Paternal Grandmother 4   Cancer Paternal Grandmother        late 3s    Diabetes Paternal Grandfather    Heart disease Paternal Grandfather    Social History   Socioeconomic History   Marital status: Divorced    Spouse name: Not on file   Number of children: Not on file   Years of education: Not on file   Highest education level: Not on file  Occupational History   Not on file  Tobacco Use   Smoking status: Never   Smokeless tobacco: Never  Vaping Use   Vaping Use: Never used  Substance and Sexual Activity   Alcohol use: No   Drug use: No   Sexual activity: Yes    Birth control/protection: None, Surgical    Comment: Vasectomy  Other Topics Concern   Not on file  Social History Narrative   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   hydrochlorothiazide (HYDRODIURIL) 25 MG tablet TAKE 0.5 TABLETS (12.5 MG TOTAL) BY MOUTH DAILY. IN THE MORNING   levothyroxine (SYNTHROID) 88 MCG tablet TAKE 1 TABLET (88 MCG TOTAL) BY MOUTH DAILY BEFORE BREAKFAST. 30 MINUTES BEFORE BREAKFAST   [DISCONTINUED] sertraline (ZOLOFT) 50 MG tablet Take 1 tablet (50 mg total) by mouth daily. appt further refills   Allergies  Allergen Reactions   Penicillins Hives   No results found for this or any previous visit (from the past 2160 hour(s)). Objective  Body mass index is 33.98 kg/m. Wt Readings from  Last 3 Encounters:  04/24/21 191 lb 12.8 oz (87 kg)  11/21/20 192 lb (87.1 kg)  11/10/20 188 lb 3.2 oz (85.4 kg)   Temp Readings from Last 3 Encounters:  04/24/21 (!) 97 F (36.1 C) (Temporal)  11/10/20 98.4 F (36.9 C) (Oral)  04/21/20 97.9 F (36.6 C) (Oral)   BP Readings from Last 3 Encounters:  04/24/21 118/80  11/21/20 110/80  11/10/20 100/62   Pulse Readings from Last 3 Encounters:  04/24/21 73  11/10/20 83  04/21/20 70    Physical Exam Vitals and nursing note reviewed.  Constitutional:      Appearance: Normal appearance. She is well-developed and well-groomed.  HENT:     Head: Normocephalic and atraumatic.  Eyes:     Conjunctiva/sclera: Conjunctivae normal.     Pupils: Pupils are equal, round, and reactive to light.  Cardiovascular:     Rate and Rhythm: Normal rate and regular rhythm.     Heart sounds: Normal heart sounds. No murmur heard. Pulmonary:     Effort: Pulmonary effort is normal.     Breath sounds: Normal breath sounds.  Chest:     Chest wall: No mass.  Breasts:    Breasts are symmetrical.     Right: Normal.     Left: Normal.  Abdominal:     General: Abdomen is flat. Bowel sounds are normal.     Tenderness: There is no abdominal tenderness.  Musculoskeletal:        General: No tenderness.  Lymphadenopathy:     Upper Body:     Right upper body: No axillary adenopathy.     Left upper body: No axillary adenopathy.  Skin:    General: Skin is warm and dry.  Neurological:     General: No focal deficit present.     Mental Status: She is alert and oriented to person, place, and time. Mental status is at baseline.     Cranial Nerves: Cranial nerves 2-12 are intact.     Gait: Gait is intact.  Psychiatric:        Attention and Perception: Attention and perception normal.        Mood and Affect: Mood and affect normal.        Speech: Speech normal.        Behavior: Behavior normal. Behavior is cooperative.        Thought Content: Thought content  normal.        Cognition and Memory: Cognition and memory normal.  Judgment: Judgment normal.    Assessment  Plan  Annual physical exam -  Flu shot utd 2022  covid 4/4 Tdap utd  Hep B new vx 2/2 immune MMR immune Consider prevnar  mammogram 05/15/18 negative, 05/21/2019 neg order in sch 05/22/20 neg sch 06/01/21    Colonoscopy referred today blood in stool    Pap 05/18/18 neg HPV neg [facility] 05/24/19 FH uterine cancer mom  Labs 04/03/2020  Pap hpv + 11/21/20 f/u 07/2021 ob/gyn   [facility] years prior 04/24/21 no issues  Cont healthy diet and exercise    Blood in stool - Plan: Ambulatory referral to Gastroenterology Encounter for screening colonoscopy - Plan: Ambulatory referral to Gastroenterology  Pelvic pain - Plan: US Pelvic Complete With Transvaginal Cysts of both ovaries - Plan: US Pelvic Complete With Transvaginal Irregular periods/menstrual cycles - Plan: US Pelvic Complete With Transvaginal  Anxiety controlled- Plan: sertraline (ZOLOFT) 50 MG tablet  Hematuria, unspecified type - Plan: Urinalysis, Routine w reflex microscopic, Urine Culture  Hypothyroidism, unspecified type - Plan: TSH on levo 88 mcg qd   Hypertension, controlled - Plan: Comprehensive metabolic panel, Lipid panel, CBC with Differential/Platelet  On hctz 12.5 mg qd and norvasc 5 mg qd    Provider: Dr. Olivia Mackie McLean-Scocuzza-Internal Medicine

## 2021-04-24 NOTE — Progress Notes (Signed)
Liver kidneys normal  Cholesterol worse  -does she want to try healthy diet and exercise  -goal LDL <100 and total cholesterol <200  -consider low dose statin cholesterol medication  -is she agreeable? Thyroid lab normal Blood cts normal  Urine culture pending

## 2021-04-25 LAB — URINE CULTURE
MICRO NUMBER:: 12669882
SPECIMEN QUALITY:: ADEQUATE

## 2021-04-28 ENCOUNTER — Encounter: Payer: Self-pay | Admitting: Internal Medicine

## 2021-04-30 ENCOUNTER — Other Ambulatory Visit: Payer: Self-pay | Admitting: Internal Medicine

## 2021-04-30 DIAGNOSIS — E785 Hyperlipidemia, unspecified: Secondary | ICD-10-CM

## 2021-04-30 MED ORDER — PRAVASTATIN SODIUM 20 MG PO TABS: 20.0000 mg | ORAL_TABLET | Freq: Every day | ORAL | 3 refills | Status: DC

## 2021-05-02 ENCOUNTER — Telehealth: Payer: Self-pay | Admitting: Internal Medicine

## 2021-05-02 NOTE — Telephone Encounter (Signed)
Thressa Sheller, CMA  05/02/2021  3:41 PM EST Back to Top    Left message to return call.   Letter sent to call back for results    Gordy Councilman, [facility]  04/29/2021  2:57 PM EST     I called and LVM for the patient to call back after reading the mychart message sent about the lab results if she has any questions or concerns.  Nina,cma    Nino Glow McLean-Scocuzza, MD  04/28/2021 11:02 AM EST     Liver kidneys normal  Cholesterol worse  -does she want to try healthy diet and exercise  -goal LDL <100 and total cholesterol <200  -consider low dose statin cholesterol medication  -is she agreeable? Thyroid lab normal Blood cts normal  Urine culture no UTI

## 2021-05-16 NOTE — Telephone Encounter (Signed)
Good morning,   Patient reaching out to check the status of the transvaginal ultrasound. Please advise

## 2021-05-17 ENCOUNTER — Telehealth: Payer: Self-pay | Admitting: Internal Medicine

## 2021-05-17 NOTE — Telephone Encounter (Signed)
Lft pt vm to call [phone] press 3 and then 2 to sch [place]. thanks

## 2021-05-29 ENCOUNTER — Ambulatory Visit
Admission: RE | Admit: 2021-05-29 | Discharge: 2021-05-29 | Disposition: A | Discharge status: Home / Self Care | Payer: Federal, State, Local not specified - PPO | Source: Ambulatory Visit | Attending: Internal Medicine | Admitting: Internal Medicine

## 2021-05-29 ENCOUNTER — Other Ambulatory Visit: Payer: Self-pay

## 2021-05-29 DIAGNOSIS — R102 Pelvic and perineal pain: Secondary | ICD-10-CM | POA: Insufficient documentation

## 2021-05-29 DIAGNOSIS — N83202 Unspecified ovarian cyst, left side: Secondary | ICD-10-CM | POA: Diagnosis not present

## 2021-05-29 DIAGNOSIS — N926 Irregular menstruation, unspecified: Secondary | ICD-10-CM | POA: Insufficient documentation

## 2021-05-29 DIAGNOSIS — N83201 Unspecified ovarian cyst, right side: Secondary | ICD-10-CM | POA: Insufficient documentation

## 2021-06-01 ENCOUNTER — Other Ambulatory Visit: Payer: Self-pay

## 2021-06-01 ENCOUNTER — Ambulatory Visit
Admission: RE | Admit: 2021-06-01 | Discharge: 2021-06-01 | Disposition: A | Discharge status: Home / Self Care | Payer: Federal, State, Local not specified - PPO | Source: Ambulatory Visit | Attending: Internal Medicine | Admitting: Internal Medicine

## 2021-06-01 DIAGNOSIS — Z1231 Encounter for screening mammogram for malignant neoplasm of breast: Secondary | ICD-10-CM | POA: Diagnosis not present

## 2021-06-06 ENCOUNTER — Ambulatory Visit
Admission: RE | Admit: 2021-06-06 | Discharge: 2021-06-06 | Disposition: A | Discharge status: Home / Self Care | Payer: Federal, State, Local not specified - PPO | Source: Ambulatory Visit | Attending: Emergency Medicine | Admitting: Emergency Medicine

## 2021-06-06 ENCOUNTER — Other Ambulatory Visit: Payer: Self-pay

## 2021-06-06 VITALS — BP 138/81 | HR 84 | Temp 98.1°F | Resp 18

## 2021-06-06 DIAGNOSIS — H109 Unspecified conjunctivitis: Secondary | ICD-10-CM | POA: Diagnosis not present

## 2021-06-06 MED ORDER — POLYMYXIN B-TRIMETHOPRIM 10000-0.1 UNIT/ML-% OP SOLN
1.0000 [drp] | Freq: Four times a day (QID) | OPHTHALMIC | 0 refills | Status: AC
Start: 2021-06-06 — End: 2021-06-13

## 2021-06-06 NOTE — ED Provider Notes (Signed)
Roderic Palau    CSN: [id number] Arrival date & time: 06/06/21  1836      History   Chief Complaint Chief Complaint  Patient presents with   Eye Irritation     HPI Kaitlin Strickland is a 46 y.o. female.  Patient presents with left eye redness today.  She had crusting in her eyelashes this morning.  Mild tenderness with palpation of eye but no acute eye pain or changes in vision.  No eye drainage today; No foreign body sensation.  No eye injury or trauma.  No fever, congestion, runny nose, cough, or other symptoms.  No treatment at home.  Her medical history includes hypertension.    The history is provided by the patient and medical records.   Past Medical History:  Diagnosis Date   Anxiety    BV (bacterial vaginosis)    Depression    History of chicken pox    Hypertension    Hypothyroid    h/o abnormal elevated tsh    MVP (mitral valve prolapse) 2000   resolved 2005   Ovarian cyst    Vitamin D deficiency     Patient Active Problem List   Diagnosis Date Noted   Family history of uterine cancer 11/21/2020   Essential hypertension 11/16/2019   Obesity (BMI 30.0-34.9) 11/16/2019   Plantar fasciitis, right 10/19/2019   Bacterial vaginosis 05/26/2019   Hyperlipidemia 04/09/2019   Annual physical exam 07/31/2018   Anxiety 03/17/2018   Vitamin D deficiency 03/17/2018   Hypothyroidism 03/17/2018   Breast hypertrophy in female 08/02/2015    Past Surgical History:  Procedure Laterality Date   CYSTOSCOPY     childhood   REDUCTION MAMMAPLASTY Bilateral 08/2015   wisdom teeth removal      OB History     Gravida  1   Para  1   Term  1   Preterm      AB      Living  1      SAB      IAB      Ectopic      Multiple      Live Births  1            Home Medications    Prior to Admission medications   Medication Sig Start Date End Date Taking? Authorizing Provider  pravastatin (PRAVACHOL) 20 MG tablet Take 1 tablet (20 mg total) by mouth  daily. After 6 pm 04/30/21   McLean-Scocuzza, Nino Glow, MD  trimethoprim-polymyxin b (POLYTRIM) ophthalmic solution Place 1 drop into both eyes 4 (four) times daily for 7 days. 06/06/21 06/13/21 Yes Sharion Balloon, NP  amLODipine (NORVASC) 5 MG tablet TAKE 1 TABLET BY MOUTH EVERY DAY 11/14/20   McLean-Scocuzza, Nino Glow, MD  cholecalciferol (VITAMIN D) 1000 UNITS tablet Take 2 Units by mouth daily.    [provider]  hydrochlorothiazide (HYDRODIURIL) 25 MG tablet TAKE 0.5 TABLETS (12.5 MG TOTAL) BY MOUTH DAILY. IN THE MORNING 02/14/21   McLean-Scocuzza, Nino Glow, MD  levothyroxine (SYNTHROID) 88 MCG tablet TAKE 1 TABLET (88 MCG TOTAL) BY MOUTH DAILY BEFORE BREAKFAST. [place] 03/20/21   McLean-Scocuzza, Nino Glow, MD  sertraline (ZOLOFT) 50 MG tablet Take 1 tablet (50 mg total) by mouth daily. appt further refills 04/24/21   McLean-Scocuzza, Nino Glow, MD    Family History Family History  Problem Relation Age of Onset   Cancer Mother        uterine   Hypertension Mother  Arthritis Mother    Depression Mother    Hyperlipidemia Mother    64 / [place] Mother    Thyroid disease Mother    Uterine cancer Mother        stage 49 dx'ed age 66 s/p hysterectomy/chemo; neg genetic testing   Arthritis Father    Hypertension Father    Diabetes type II Maternal Grandmother    Arthritis Maternal Grandmother    Depression Maternal Grandmother    Diabetes Maternal Grandmother    Hyperlipidemia Maternal Grandmother    Hypertension Maternal Grandmother    Intellectual disability Maternal Grandmother    Hyperlipidemia Maternal Grandfather    Cancer Maternal Grandfather        SCC mohs    Breast cancer Paternal Grandmother 70   Cancer Paternal Grandmother        late 62s    Diabetes Paternal Grandfather    Heart disease Paternal Grandfather     Social History Social History   Tobacco Use   Smoking status: Never   Smokeless tobacco: Never  Vaping Use   Vaping  Use: Never used  Substance Use Topics   Alcohol use: No   Drug use: No     Allergies   Penicillins   Review of Systems Review of Systems  Constitutional:  Negative for chills and fever.  HENT:  Negative for ear pain and sore throat.   Eyes:  Positive for discharge and redness. Negative for photophobia, pain, itching and visual disturbance.  Respiratory:  Negative for cough and shortness of breath.   All other systems reviewed and are negative.   Physical Exam Triage Vital Signs ED Triage Vitals  Enc Vitals Group     BP 06/06/21 1858 138/81     Pulse Rate 06/06/21 1858 84     Resp 06/06/21 1858 18     Temp 06/06/21 1858 98.1 F (36.7 C)     Temp Source 06/06/21 1858 Oral     SpO2 06/06/21 1858 96 %     Weight --      Height --      Head Circumference --      Peak Flow --      Pain Score 06/06/21 1859 0     Pain Loc --      Pain Edu? --      Excl. in [place]? --    No data found.  Updated Vital Signs BP 138/81 (BP Location: Left Arm)    Pulse 84    Temp 98.1 F (36.7 C) (Oral)    Resp 18    LMP 05/24/2021    SpO2 96%   Visual Acuity Right Eye Distance: 20/40 (corrected) Left Eye Distance: 20/30 (corrected) Bilateral Distance: 20/20 (corrected)  Right Eye Near:   Left Eye Near:    Bilateral Near:     Physical Exam Vitals and nursing note reviewed.  Constitutional:      General: She is not in acute distress.    Appearance: She is well-developed. She is not ill-appearing.  HENT:     Left Ear: Tympanic membrane normal.     Nose: Nose normal.     Mouth/Throat:     Mouth: Mucous membranes are moist.     Pharynx: Oropharynx is clear.  Eyes:     General: Lids are normal. Vision grossly intact.        Right eye: No discharge.        Left eye: No discharge.     Extraocular Movements: Extraocular  movements intact.     Conjunctiva/sclera:     Right eye: Right conjunctiva is not injected.     Left eye: Left conjunctiva is injected.  Cardiovascular:     Rate and  Rhythm: Normal rate and regular rhythm.     Heart sounds: Normal heart sounds.  Pulmonary:     Effort: Pulmonary effort is normal. No respiratory distress.     Breath sounds: Normal breath sounds.  Musculoskeletal:     Cervical back: Neck supple.  Skin:    General: Skin is warm and dry.  Neurological:     Mental Status: She is alert.  Psychiatric:        Mood and Affect: Mood normal.        Behavior: Behavior normal.     UC Treatments / Results  Labs (all labs ordered are listed, but only abnormal results are displayed) Labs Reviewed - No data to display  EKG   Radiology No results found.  Procedures Procedures (including critical care time)  Medications Ordered in UC Medications - No data to display  Initial Impression / Assessment and Plan / UC Course  I have reviewed the triage vital signs and the nursing notes.  Pertinent labs & imaging results that were available during my care of the patient were reviewed by me and considered in my medical decision making (see chart for details).  Left eye conjunctivitis.  Discussed with patient that this is likely viral conjunctivitis.  Education provided on viral vs bacterial conjunctivitis. Discussed saline eye drops.  If symptoms persist or worsen, start Polytrim eye drops.  If not improving, instructed patient to follow up with her eye care provider.  ED precautions discussed.  Patient agrees to plan of care.     Final Clinical Impressions(s) / UC Diagnoses   Final diagnoses:  Conjunctivitis of left eye, unspecified conjunctivitis type     Discharge Instructions      If your symptoms persist or worsen, use the antibiotic eyedrops as prescribed.    Follow-up with your eye doctor for a recheck in 1 to 2 days if your symptoms are not improving.    Go to the emergency department if you have acute eye pain, changes in your vision, or other concerning symptoms.        ED Prescriptions     Medication Sig Dispense  Auth. Provider   trimethoprim-polymyxin b (POLYTRIM) ophthalmic solution Place 1 drop into both eyes 4 (four) times daily for 7 days. 10 mL Sharion Balloon, NP      PDMP not reviewed this encounter.   Sharion Balloon, NP 06/06/21 [place]

## 2021-06-06 NOTE — ED Triage Notes (Signed)
Pt presents with left eye redness and tender to touch.

## 2021-06-06 NOTE — Discharge Instructions (Addendum)
If your symptoms persist or worsen, use the antibiotic eyedrops as prescribed.    Follow-up with your eye doctor for a recheck in 1 to 2 days if your symptoms are not improving.    Go to the emergency department if you have acute eye pain, changes in your vision, or other concerning symptoms.

## 2021-07-04 DIAGNOSIS — K625 Hemorrhage of anus and rectum: Secondary | ICD-10-CM | POA: Diagnosis not present

## 2021-07-23 ENCOUNTER — Encounter: Payer: Self-pay | Admitting: Obstetrics and Gynecology

## 2021-07-23 ENCOUNTER — Ambulatory Visit (INDEPENDENT_AMBULATORY_CARE_PROVIDER_SITE_OTHER): Payer: Federal, State, Local not specified - PPO | Admitting: Obstetrics and Gynecology

## 2021-07-23 ENCOUNTER — Other Ambulatory Visit (HOSPITAL_COMMUNITY)
Admission: RE | Admit: 2021-07-23 | Discharge: 2021-07-23 | Disposition: A | Discharge status: Home / Self Care | Payer: Federal, State, Local not specified - PPO | Source: Ambulatory Visit | Attending: Obstetrics and Gynecology | Admitting: Obstetrics and Gynecology

## 2021-07-23 ENCOUNTER — Other Ambulatory Visit: Payer: Self-pay

## 2021-07-23 VITALS — BP 130/80 | Ht 62.0 in | Wt 183.0 lb

## 2021-07-23 DIAGNOSIS — R87610 Atypical squamous cells of undetermined significance on cytologic smear of cervix (ASC-US): Secondary | ICD-10-CM

## 2021-07-23 DIAGNOSIS — R87810 Cervical high risk human papillomavirus (HPV) DNA test positive: Secondary | ICD-10-CM | POA: Diagnosis not present

## 2021-07-23 DIAGNOSIS — Z01419 Encounter for gynecological examination (general) (routine) without abnormal findings: Secondary | ICD-10-CM

## 2021-07-23 DIAGNOSIS — N6324 Unspecified lump in the left breast, lower inner quadrant: Secondary | ICD-10-CM

## 2021-07-23 DIAGNOSIS — Z1151 Encounter for screening for human papillomavirus (HPV): Secondary | ICD-10-CM | POA: Diagnosis not present

## 2021-07-23 DIAGNOSIS — Z124 Encounter for screening for malignant neoplasm of cervix: Secondary | ICD-10-CM | POA: Diagnosis not present

## 2021-07-23 DIAGNOSIS — Z1231 Encounter for screening mammogram for malignant neoplasm of breast: Secondary | ICD-10-CM

## 2021-07-23 NOTE — Progress Notes (Signed)
PCP:  McLean-Scocuzza, Nino Glow, MD   Chief Complaint  Patient presents with   Gynecologic Exam    No concerns    HPI:      Ms. Kaitlin Strickland is a 46 y.o. No obstetric history on file. who LMP was Patient's last menstrual period was 07/13/2021 (exact date)., presents today for her annual examination.  Her menses are Q3-4 wks, lasting 4-5 days, light flow. Stopped OCPs due to HTN dx. Dysmenorrhea none. She does not have intermenstrual bleeding.   Sex activity: currently sexually active--contraception vasectomy. No pain/bleeding. Last Pap: 11/21/20  Results were: ASCUS/POS HPV DNA, repeat due after 1 yr (today) per [facility]. Hx of STDs: none  Last Mammogram: 06/01/21 with PCP. Results: normal. Repeat in 1 yr.; Has LT breast mass with breast u/s eval 10/17--"8 mm area of fat necrosis and developing oil cyst formation at the medial edge of the left areola, corresponding to the palpable mass". Pt feels the mass has gotten a little larger.  There is a FH of breast cancer in her PGM, mat grt aunt, genetic testing not indicated. There is no FH of ovarian cancer. Her mom was diagnosed with uterine cancer last yr and was "gene neg". Has returned to mets in lymph nodes recently, doing tx. The patient does do self-breast exams. Had burn on LT breast 2020 due to heating pad use; sx resolved. Doing fine.  Tobacco use: The patient denies current or previous tobacco use. Alcohol use: none No drug use.  Exercise: moderately active  Colonoscopy: sheds 4/23 for chronic diarrhea  She does get adequate calcium and Vitamin D in her diet.  Meds/labs with PCP   Past Medical History:  Diagnosis Date   Anxiety    BV (bacterial vaginosis)    Depression    History of chicken pox    Hypertension    Hypothyroid    h/o abnormal elevated tsh    MVP (mitral valve prolapse) 2000   resolved 2005   Ovarian cyst    Vitamin D deficiency     Past Surgical History:  Procedure Laterality Date   CYSTOSCOPY      childhood   REDUCTION MAMMAPLASTY Bilateral 08/2015   wisdom teeth removal      Family History  Problem Relation Age of Onset   Cancer Mother        uterine   Hypertension Mother    Arthritis Mother    Depression Mother    Hyperlipidemia Mother    Miscarriages / [place] Mother    Thyroid disease Mother    Uterine cancer Mother        stage 32 dx'ed age 94 s/p hysterectomy/chemo; neg genetic testing   Arthritis Father    Hypertension Father    Diabetes type II Maternal Grandmother    Arthritis Maternal Grandmother    Depression Maternal Grandmother    Diabetes Maternal Grandmother    Hyperlipidemia Maternal Grandmother    Hypertension Maternal Grandmother    Intellectual disability Maternal Grandmother    Hyperlipidemia Maternal Grandfather    Cancer Maternal Grandfather        SCC mohs    Breast cancer Paternal Grandmother 22   Cancer Paternal Grandmother        late 63s    Diabetes Paternal Grandfather    Heart disease Paternal Grandfather     Social History   Socioeconomic History   Marital status: Single    Spouse name: Not on file   Number of children: Not on  file   Years of education: Not on file   Highest education level: Not on file  Occupational History   Not on file  Tobacco Use   Smoking status: Never   Smokeless tobacco: Never  Vaping Use   Vaping Use: Never used  Substance and Sexual Activity   Alcohol use: No   Drug use: No   Sexual activity: Yes    Birth control/protection: None, Surgical    Comment: Vasectomy  Other Topics Concern   Not on file  Social History Narrative   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   hydrochlorothiazide (HYDRODIURIL) 25 MG tablet TAKE 0.5 TABLETS (12.5 MG TOTAL) BY MOUTH DAILY. IN THE MORNING   levothyroxine (SYNTHROID) 88 MCG tablet TAKE 1 TABLET (88 MCG TOTAL) BY MOUTH DAILY BEFORE BREAKFAST. 30 MINUTES BEFORE BREAKFAST   pravastatin (PRAVACHOL) 20 MG tablet Take 1 tablet (20 mg total) by mouth daily. After 6 pm   sertraline (ZOLOFT) 50 MG tablet Take 1 tablet (50 mg total) by mouth daily. appt further refills     ROS:  Review of Systems  Constitutional:  Negative for fatigue, fever and unexpected weight change.  Respiratory:  Negative for cough, shortness of breath and wheezing.   Cardiovascular:  Negative for chest pain, palpitations and leg swelling.  Gastrointestinal:  Positive for diarrhea. Negative for blood in stool, constipation, nausea and vomiting.  Endocrine: Negative for cold intolerance, heat intolerance and polyuria.  Genitourinary:  Negative for dyspareunia, dysuria, flank pain, frequency, genital sores, hematuria, menstrual problem, pelvic pain, urgency, vaginal bleeding, vaginal discharge and vaginal pain.  Musculoskeletal:  Negative for back pain, joint swelling and myalgias.  Skin:  Negative for rash.  Neurological:  Negative for dizziness, syncope, light-headedness, numbness and headaches.  Hematological:  Negative for adenopathy.  Psychiatric/Behavioral:  Negative for agitation, confusion, sleep disturbance and suicidal ideas. The patient is not nervous/anxious.     Objective: BP 130/80    Ht 5\' 2"  (1.575 m)    Wt 183 lb (83 kg)    LMP 07/13/2021 (Exact Date)    BMI 33.47 kg/m    Physical Exam Constitutional:      Appearance: She is well-developed.  Genitourinary:     Vulva normal.     Right Labia: No  rash, tenderness or lesions.    Left Labia: No tenderness, lesions or rash.    No vaginal discharge, erythema or tenderness.      Right Adnexa: not tender and no mass present.    Left Adnexa: not tender and no mass present.    No cervical friability or polyp.     Uterus is not enlarged or tender.  Breasts:    Right: No mass, nipple discharge, skin change or tenderness.     Left: Mass present. No nipple discharge, skin  change or tenderness.  Neck:     Thyroid: No thyromegaly.  Cardiovascular:     Rate and Rhythm: Normal rate and regular rhythm.     Heart sounds: Normal heart sounds. No murmur heard. Pulmonary:     Effort: Pulmonary effort is normal.     Breath sounds: Normal breath sounds.  Chest:    Abdominal:     Palpations: Abdomen is soft.     Tenderness: There is no abdominal tenderness. There is no guarding or rebound.  Musculoskeletal:        General: Normal range of motion.     Cervical back: Normal range of motion.  Lymphadenopathy:     Cervical: No cervical adenopathy.  Neurological:     General: No focal deficit present.     Mental Status: She is alert and oriented to person, place, and time.     Cranial Nerves: No cranial nerve deficit.  Skin:    General: Skin is warm and dry.  Psychiatric:        Mood and Affect: Mood normal.        Behavior: Behavior normal.        Thought Content: Thought content normal.        Judgment: Judgment normal.  Vitals reviewed.    Assessment/Plan: Encounter for annual routine gynecological examination  Cervical cancer screening - Plan: Cytology - PAP  Screening for HPV (human papillomavirus) - Plan: Cytology - PAP  ASCUS with positive high risk HPV cervical - Plan: Cytology - PAP; repeat pap today, will f/u with results.   Encounter for screening mammogram for malignant neoplasm of breast; pt current on mammo  Mass of lower inner quadrant of left breast--hx of benign lesion on breast u/s 2017; can refer to gen surg for  excision. Pt to f/u prn.    GYN counsel adequate intake of calcium and vitamin D, diet and exercise     F/U  Return in about 1 year (around 07/23/2022).  Cammie Faulstich B. Retia Cordle, PA-C 07/23/2021 4:59 PM

## 2021-07-27 LAB — CYTOLOGY - PAP
Comment: NEGATIVE
Diagnosis: NEGATIVE
High risk HPV: NEGATIVE

## 2021-09-06 DIAGNOSIS — R197 Diarrhea, unspecified: Secondary | ICD-10-CM | POA: Diagnosis not present

## 2021-09-06 DIAGNOSIS — D123 Benign neoplasm of transverse colon: Secondary | ICD-10-CM | POA: Diagnosis not present

## 2021-09-06 DIAGNOSIS — K625 Hemorrhage of anus and rectum: Secondary | ICD-10-CM | POA: Diagnosis not present

## 2021-09-06 LAB — HM COLONOSCOPY

## 2021-09-24 ENCOUNTER — Encounter: Payer: Self-pay | Admitting: Internal Medicine

## 2021-09-24 DIAGNOSIS — D126 Benign neoplasm of colon, unspecified: Secondary | ICD-10-CM | POA: Insufficient documentation

## 2021-10-16 ENCOUNTER — Encounter: Payer: Self-pay | Admitting: Internal Medicine

## 2021-10-24 ENCOUNTER — Encounter: Payer: Self-pay | Admitting: Internal Medicine

## 2021-10-24 ENCOUNTER — Ambulatory Visit: Payer: Federal, State, Local not specified - PPO | Admitting: Internal Medicine

## 2021-10-24 VITALS — BP 116/78 | HR 64 | Temp 98.0°F | Resp 14 | Ht 62.0 in | Wt 183.8 lb

## 2021-10-24 DIAGNOSIS — Z1231 Encounter for screening mammogram for malignant neoplasm of breast: Secondary | ICD-10-CM | POA: Diagnosis not present

## 2021-10-24 DIAGNOSIS — E039 Hypothyroidism, unspecified: Secondary | ICD-10-CM

## 2021-10-24 DIAGNOSIS — E785 Hyperlipidemia, unspecified: Secondary | ICD-10-CM

## 2021-10-24 DIAGNOSIS — I10 Essential (primary) hypertension: Secondary | ICD-10-CM

## 2021-10-24 DIAGNOSIS — F419 Anxiety disorder, unspecified: Secondary | ICD-10-CM | POA: Diagnosis not present

## 2021-10-24 LAB — COMPREHENSIVE METABOLIC PANEL
ALT: 14 U/L (ref 0–35)
AST: 18 U/L (ref 0–37)
Albumin: 4.6 g/dL (ref 3.5–5.2)
Alkaline Phosphatase: 72 U/L (ref 39–117)
BUN: 15 mg/dL (ref 6–23)
CO2: 29 mEq/L (ref 19–32)
Calcium: 9.5 mg/dL (ref 8.4–10.5)
Chloride: 101 mEq/L (ref 96–112)
Creatinine, Ser: 0.81 mg/dL (ref 0.40–1.20)
GFR: 88.92 mL/min (ref >60.00)
Glucose, Bld: 77 mg/dL (ref 70–99)
Potassium: 4.4 mEq/L (ref 3.5–5.1)
Sodium: 140 mEq/L (ref 135–145)
Total Bilirubin: 0.7 mg/dL (ref 0.2–1.2)
Total Protein: 6.9 g/dL (ref 6.0–8.3)

## 2021-10-24 LAB — CBC WITH DIFFERENTIAL/PLATELET
Basophils Absolute: 0.1 10*3/uL (ref 0.0–0.1)
Basophils Relative: 1 % (ref 0.0–3.0)
Eosinophils Absolute: 0.1 10*3/uL (ref 0.0–0.7)
Eosinophils Relative: 1.6 % (ref 0.0–5.0)
HCT: 42.4 % (ref 36.0–46.0)
Hemoglobin: 14.4 g/dL (ref 12.0–15.0)
Lymphocytes Relative: 36.6 % (ref 12.0–46.0)
Lymphs Abs: 2.3 10*3/uL (ref 0.7–4.0)
MCHC: 33.8 g/dL (ref 30.0–36.0)
MCV: 90 fl (ref 78.0–100.0)
Monocytes Absolute: 0.4 10*3/uL (ref 0.1–1.0)
Monocytes Relative: 5.8 % (ref 3.0–12.0)
Neutro Abs: 3.4 10*3/uL (ref 1.4–7.7)
Neutrophils Relative %: 55 % (ref 43.0–77.0)
Platelets: 258 10*3/uL (ref 150.0–400.0)
RBC: 4.72 Mil/uL (ref 3.87–5.11)
RDW: 13.5 % (ref 11.5–15.5)
WBC: 6.2 10*3/uL (ref 4.0–10.5)

## 2021-10-24 LAB — LIPID PANEL
Cholesterol: 178 mg/dL (ref 0–200)
HDL: 52.4 mg/dL (ref >39.00)
LDL Cholesterol: 100 mg/dL — ABNORMAL HIGH (ref 0–99)
NonHDL: 125.51
Total CHOL/HDL Ratio: 3
Triglycerides: 128 mg/dL (ref 0.0–149.0)
VLDL: 25.6 mg/dL (ref 0.0–40.0)

## 2021-10-24 LAB — TSH: TSH: 2.99 u[IU]/mL (ref 0.35–5.50)

## 2021-10-24 MED ORDER — LEVOTHYROXINE SODIUM 88 MCG PO TABS: 88.0000 ug | ORAL_TABLET | Freq: Every day | ORAL | 3 refills | Status: DC

## 2021-10-24 MED ORDER — HYDROCHLOROTHIAZIDE 25 MG PO TABS: ORAL_TABLET | ORAL | 7 refills | Status: DC

## 2021-10-24 MED ORDER — AMLODIPINE BESYLATE 5 MG PO TABS: 5.0000 mg | ORAL_TABLET | Freq: Every day | ORAL | 3 refills | Status: DC

## 2021-10-24 MED ORDER — PRAVASTATIN SODIUM 20 MG PO TABS: 20.0000 mg | ORAL_TABLET | Freq: Every day | ORAL | 3 refills | Status: DC

## 2021-10-24 MED ORDER — SERTRALINE HCL 50 MG PO TABS: 50.0000 mg | ORAL_TABLET | Freq: Every day | ORAL | 3 refills | Status: DC

## 2021-10-24 NOTE — Progress Notes (Signed)
Chief Complaint  Patient presents with   Follow-up    6 mon, no concerns   F/u  1.fmla yearly paperwork filled out for mom health who has cancer and lives in [place]  Pt givne copy today  2. Htn controlled on norvasc 5 mg hctz 12.5 mg qd  3. Hypothyroidism on levo 88 mcg 4. Hld on pravachol 20 mg qhs   Review of Systems  Constitutional:  Negative for weight loss.  HENT:  Negative for hearing loss.   Eyes:  Negative for blurred vision.  Respiratory:  Negative for shortness of breath.   Cardiovascular:  Negative for chest pain.  Gastrointestinal:  Negative for abdominal pain and blood in stool.  Genitourinary:  Negative for dysuria.  Musculoskeletal:  Negative for falls and joint pain.  Skin:  Negative for rash.  Neurological:  Negative for headaches.  Psychiatric/Behavioral:  Negative for depression.   Past Medical History:  Diagnosis Date   Anxiety    BV (bacterial vaginosis)    Depression    History of chicken pox    Hypertension    Hypothyroid    h/o abnormal elevated tsh    MVP (mitral valve prolapse) 2000   resolved 2005   Ovarian cyst    Tubular adenoma of colon    09/06/21 [facility] f/u in 7 years Dr. Haig Prophet   Vitamin D deficiency    Past Surgical History:  Procedure Laterality Date   CYSTOSCOPY     childhood   REDUCTION MAMMAPLASTY Bilateral 08/2015   wisdom teeth removal     Family History  Problem Relation Age of Onset   Cancer Mother        uterine   Hypertension Mother    Arthritis Mother    Depression Mother    Hyperlipidemia Mother    Miscarriages / [place] Mother    Thyroid disease Mother    Uterine cancer Mother        stage 3 dx'ed age 50 s/p hysterectomy/chemo; neg genetic testing   Arthritis Father    Hypertension Father    Diabetes type II Maternal Grandmother    Arthritis Maternal Grandmother    Depression Maternal Grandmother    Diabetes Maternal Grandmother    Hyperlipidemia Maternal Grandmother    Hypertension Maternal Grandmother     Intellectual disability Maternal Grandmother    Hyperlipidemia Maternal Grandfather    Cancer Maternal Grandfather        SCC mohs    Breast cancer Paternal Grandmother 74   Cancer Paternal Grandmother        late 82s    Diabetes Paternal Grandfather    Heart disease Paternal Grandfather    Social History   Socioeconomic History   Marital status: Single    Spouse name: Not on file   Number of children: Not on file   Years of education: Not on file   Highest education level: Not on file  Occupational History   Not on file  Tobacco Use   Smoking status: Never   Smokeless tobacco: Never  Vaping Use   Vaping Use: Never used  Substance and Sexual Activity   Alcohol use: No   Drug use: No   Sexual activity: Yes    Birth control/protection: None, Surgical    Comment: Vasectomy  Other Topics Concern   Not on file  Social History Narrative   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   [DISCONTINUED] amLODipine (NORVASC) 5 MG tablet TAKE 1 TABLET BY MOUTH EVERY DAY   [DISCONTINUED] hydrochlorothiazide (HYDRODIURIL) 25 MG tablet TAKE 0.5 TABLETS (12.5 MG TOTAL) BY MOUTH DAILY. IN THE MORNING   [DISCONTINUED] levothyroxine (SYNTHROID) 88 MCG tablet TAKE 1 TABLET (88 MCG TOTAL) BY MOUTH DAILY BEFORE BREAKFAST. [place]   [DISCONTINUED] pravastatin (PRAVACHOL) 20 MG tablet Take 1 tablet (20 mg total) by mouth daily. After 6 pm   [DISCONTINUED] sertraline (ZOLOFT) 50 MG tablet Take 1  tablet (50 mg total) by mouth daily. appt further refills   Allergies  Allergen Reactions   Penicillins Hives   Recent Results (from the past 2160 hour(s))  HM COLONOSCOPY     Status: None   Collection Time: 09/06/21 12:00 AM  Result Value Ref Range   HM Colonoscopy See Report (in chart) See Report (in chart), Patient Reported    Comment: tubular adenoma 09/06/21 [facility] Dr Haig Prophet 7 years   Objective  Body mass index is 33.62 kg/m. Wt Readings from Last 3 Encounters:  10/24/21 183 lb 12.8 oz (83.4 kg)  07/23/21 183 lb (83 kg)  04/24/21 191 lb 12.8 oz (87 kg)   Temp Readings from Last 3 Encounters:  10/24/21 98 F (36.7 C) (Oral)  06/06/21 98.1 F (36.7 C) (Oral)  04/24/21 (!) 97 F (36.1 C) (Temporal)   BP Readings from Last 3 Encounters:  10/24/21 116/78  07/23/21 130/80  06/06/21 138/81   Pulse Readings from Last 3 Encounters:  10/24/21 64  06/06/21 84  04/24/21 73    Physical Exam Vitals and nursing note reviewed.  Constitutional:      Appearance: Normal appearance. She is well-developed and well-groomed.  HENT:     Head: Normocephalic and atraumatic.  Eyes:     Conjunctiva/sclera: Conjunctivae normal.     Pupils: Pupils are equal, round, and reactive to light.  Cardiovascular:     Rate and Rhythm: Normal rate and regular rhythm.     Heart sounds: Normal heart sounds. No murmur heard. Pulmonary:     Effort: Pulmonary effort is normal.     Breath sounds: Normal breath sounds.  Abdominal:     General: Abdomen is flat. Bowel sounds are normal.     Tenderness: There is no abdominal tenderness.  Musculoskeletal:        General: No tenderness.  Skin:    General: Skin is warm and dry.  Neurological:     General: No focal deficit present.     Mental Status: She is alert and oriented to person, place, and time. Mental status is at baseline.     Cranial Nerves: Cranial nerves 2-12 are intact.     Motor: Motor function is intact.     Coordination: Coordination  is intact.     Gait: Gait is intact.  Psychiatric:        Attention and Perception: Attention and perception normal.        Mood and Affect:  Mood and affect normal.        Speech: Speech normal.        Behavior: Behavior normal. Behavior is cooperative.        Thought Content: Thought content normal.        Cognition and Memory: Cognition and memory normal.        Judgment: Judgment normal.    Assessment  Plan  Essential hypertension controlled- Plan: hydrochlorothiazide (HYDRODIURIL) 25 MG tablet, amLODipine (NORVASC) 5 MG tablet    Hyperlipidemia, unspecified hyperlipidemia type - Plan: Lipid panel, TSH, pravastatin (PRAVACHOL) 20 MG tablet  Anxiety - Plan: sertraline (ZOLOFT) 50 MG tablet  Acquired hypothyroidism - Plan: levothyroxine (SYNTHROID) 88 MCG tablet  HM Flu shot utd 2022  covid 4/4 Tdap utd  Hep B new vx 2/2 immune MMR immune Consider prevnar   mammogram 06/01/21 neg and ordered 2023   Colonoscopy 09/06/21 tubular adenoma f/u in 7 years [facility]   Pap 05/18/18 neg HPV neg pap [facility] 05/24/19 FH uterine cancer mom  Labs 04/03/2020  Pap hpv + 11/21/20 f/u 07/2021 ob/gyn  07/23/21 pap neg neg hpv f/u 1 year   Est. [facility] years prior 04/24/21 no issues   Cont healthy diet and exercise  Provider: Dr. Olivia Mackie McLean-Scocuzza-Internal Medicine

## 2022-04-30 ENCOUNTER — Ambulatory Visit: Payer: Federal, State, Local not specified - PPO | Admitting: Family Medicine

## 2022-04-30 ENCOUNTER — Encounter: Payer: Self-pay | Admitting: Family Medicine

## 2022-04-30 VITALS — BP 118/72 | HR 61 | Temp 97.5°F | Ht 62.0 in | Wt 189.0 lb

## 2022-04-30 DIAGNOSIS — E039 Hypothyroidism, unspecified: Secondary | ICD-10-CM | POA: Diagnosis not present

## 2022-04-30 DIAGNOSIS — I10 Essential (primary) hypertension: Secondary | ICD-10-CM | POA: Diagnosis not present

## 2022-04-30 DIAGNOSIS — R87610 Atypical squamous cells of undetermined significance on cytologic smear of cervix (ASC-US): Secondary | ICD-10-CM

## 2022-04-30 DIAGNOSIS — Z114 Encounter for screening for human immunodeficiency virus [HIV]: Secondary | ICD-10-CM

## 2022-04-30 DIAGNOSIS — F419 Anxiety disorder, unspecified: Secondary | ICD-10-CM

## 2022-04-30 DIAGNOSIS — Z1159 Encounter for screening for other viral diseases: Secondary | ICD-10-CM

## 2022-04-30 DIAGNOSIS — R87810 Cervical high risk human papillomavirus (HPV) DNA test positive: Secondary | ICD-10-CM

## 2022-04-30 DIAGNOSIS — E669 Obesity, unspecified: Secondary | ICD-10-CM

## 2022-04-30 DIAGNOSIS — E559 Vitamin D deficiency, unspecified: Secondary | ICD-10-CM

## 2022-04-30 DIAGNOSIS — Z1231 Encounter for screening mammogram for malignant neoplasm of breast: Secondary | ICD-10-CM

## 2022-04-30 DIAGNOSIS — E785 Hyperlipidemia, unspecified: Secondary | ICD-10-CM

## 2022-04-30 NOTE — Patient Instructions (Signed)
It was a pleasure meeting you today. Thank you for allowing me to take part in your health care.  Our goals for today as we discussed include:  For your blood pressure Continue Amlodipine 5 mg daily Continue Hydrochlorothiazide 12.5 mg daily   For your thyroid Last TSH normal Continue Levothyroxine 88 mcg daily  Repeat labs in 6 months   If you have any questions or concerns, please do not hesitate to call the office at [phone].  I look forward to our next visit and until then take care and stay safe.  Regards,   Carollee Leitz, MD   [facility]

## 2022-04-30 NOTE — Progress Notes (Signed)
SUBJECTIVE:   Chief Complaint  Patient presents with   Establish Care    Transfer of Care-Dr.Tracy   HPI Patient presents to clinic to transfer care  No acute concerns today  Hypertension Asymptomatic.  Currently taking amlodipine 5 mg daily and hydrochlorothiazide 12.5 mg daily.  Tolerating medication well.  Hypothyroidism Asymptomatic.  Takes Synthroid 88 mcg daily and tolerating well.  HLD. Takes Pravachol 20 mg daily.  Tolerating medication well.  Anxiety Doing well.  Currently on Zoloft 50 mg daily.  Tolerating medication well.  Denies any SI/HI.  PERTINENT PMH / PSH: Hypothyroidism Hypertension Anxiety Hyperlipidemia Vitamin D deficiency   OBJECTIVE:  BP 118/72   Pulse 61   Temp (!) 97.5 F (36.4 C) (Oral)   Ht '5\' 2"'$  (1.575 m)   Wt 189 lb (85.7 kg)   LMP 04/18/2022   SpO2 98%   BMI 34.57 kg/m    Physical Exam Vitals reviewed.  Constitutional:      General: She is not in acute distress.    Appearance: She is not ill-appearing.  HENT:     Head: Normocephalic.     Nose: Nose normal.  Eyes:     Conjunctiva/sclera: Conjunctivae normal.  Neck:     Thyroid: No thyroid mass or thyromegaly.  Cardiovascular:     Rate and Rhythm: Normal rate and regular rhythm.     Heart sounds: Normal heart sounds.  Pulmonary:     Effort: Pulmonary effort is normal.     Breath sounds: Normal breath sounds.  Abdominal:     General: Abdomen is flat. Bowel sounds are normal.     Palpations: Abdomen is soft.  Musculoskeletal:        General: Normal range of motion.     Cervical back: Normal range of motion.  Neurological:     Mental Status: She is alert and oriented to person, place, and time. Mental status is at baseline.  Psychiatric:        Mood and Affect: Mood normal.        Behavior: Behavior normal.        Thought Content: Thought content normal.        Judgment: Judgment normal.     ASSESSMENT/PLAN:  Essential hypertension Assessment &  Plan: Well-controlled on current antihypertensives. Continue amlodipine 5 mg at night Continue hydrochlorothiazide 12.5 mg daily Repeat labs at next visit  Orders: -     Comprehensive metabolic panel; Future  Hyperlipidemia, unspecified hyperlipidemia type Assessment & Plan: On statin.  No myalgias. Continue Pravachol 20 mg daily Repeat lipids at next visit  Orders: -     Lipid panel; Future  Vitamin D deficiency Assessment & Plan: On vitamin D supplementation Repeat vitamin D level at next visit  Orders: -     VITAMIN D 25 Hydroxy (Vit-D Deficiency, Fractures); Future  Hypothyroidism, unspecified type Assessment & Plan: Continue levothyroxine 88 mcg daily  Orders: -     TSH; Future  Obesity (BMI 30.0-34.9) -     Hemoglobin A1c; Future -     CBC with Differential/Platelet; Future -     Vitamin B12; Future  Encounter for screening for human immunodeficiency virus (HIV) -     HIV Antibody (routine testing w rflx); Future  Encounter for hepatitis C screening test for low risk patient -     Hepatitis C antibody; Future  Encounter for screening mammogram for malignant neoplasm of breast       -      Mammogram  referral sent  Anxiety Assessment & Plan: Continue Zoloft 50 mg daily. Recommend CBT    ASCUS with positive high risk HPV cervical Assessment & Plan: Follows with Dr. Edilia Bo, OB/GYN Pap up-to-date.    PDMP reviewed  Return in about 6 months (around 10/29/2022) for annual visit with fasting labs 1 week prior.  Carollee Leitz, MD

## 2022-05-07 ENCOUNTER — Encounter: Payer: Federal, State, Local not specified - PPO | Admitting: Internal Medicine

## 2022-05-10 ENCOUNTER — Other Ambulatory Visit: Payer: Self-pay | Admitting: Family Medicine

## 2022-05-10 DIAGNOSIS — Z1231 Encounter for screening mammogram for malignant neoplasm of breast: Secondary | ICD-10-CM

## 2022-05-12 ENCOUNTER — Encounter: Payer: Self-pay | Admitting: Family Medicine

## 2022-05-12 DIAGNOSIS — Z1159 Encounter for screening for other viral diseases: Secondary | ICD-10-CM | POA: Insufficient documentation

## 2022-05-12 DIAGNOSIS — Z1231 Encounter for screening mammogram for malignant neoplasm of breast: Secondary | ICD-10-CM | POA: Insufficient documentation

## 2022-05-12 DIAGNOSIS — Z114 Encounter for screening for human immunodeficiency virus [HIV]: Secondary | ICD-10-CM | POA: Insufficient documentation

## 2022-05-12 NOTE — Assessment & Plan Note (Signed)
On statin.  No myalgias. Continue Pravachol 20 mg daily Repeat lipids at next visit

## 2022-05-12 NOTE — Assessment & Plan Note (Signed)
Continue Zoloft 50 mg daily. Recommend CBT

## 2022-05-12 NOTE — Assessment & Plan Note (Signed)
Continue levothyroxine 88 mcg daily

## 2022-05-12 NOTE — Assessment & Plan Note (Signed)
On vitamin D supplementation Repeat vitamin D level at next visit

## 2022-05-12 NOTE — Assessment & Plan Note (Addendum)
Well-controlled on current antihypertensives. Continue amlodipine 5 mg at night Continue hydrochlorothiazide 12.5 mg daily Repeat labs at next visit

## 2022-05-12 NOTE — Assessment & Plan Note (Signed)
Follows with Dr. Edilia Bo, OB/GYN Pap up-to-date.

## 2022-06-14 ENCOUNTER — Ambulatory Visit
Admission: RE | Admit: 2022-06-14 | Discharge: 2022-06-14 | Disposition: A | Discharge status: Home / Self Care | Payer: Federal, State, Local not specified - PPO | Source: Ambulatory Visit | Attending: Family Medicine | Admitting: Family Medicine

## 2022-06-14 DIAGNOSIS — Z1231 Encounter for screening mammogram for malignant neoplasm of breast: Secondary | ICD-10-CM | POA: Diagnosis not present

## 2022-07-25 ENCOUNTER — Telehealth: Payer: Self-pay | Admitting: Obstetrics and Gynecology

## 2022-07-25 NOTE — Telephone Encounter (Signed)
Left message for patient to call office back in regards to her new time/date for her appt.

## 2022-08-05 NOTE — Telephone Encounter (Signed)
I contacted the patient about scheduling  change. I left message for patient to call back to confirm new date/ time.

## 2022-08-26 ENCOUNTER — Ambulatory Visit: Payer: Federal, State, Local not specified - PPO | Admitting: Obstetrics and Gynecology

## 2022-09-01 NOTE — Progress Notes (Unsigned)
PCP:  Carollee Leitz, MD   No chief complaint on file.   HPI:      Ms. Kaitlin Strickland is a 47 y.o. No obstetric history on file. who LMP was No LMP recorded., presents today for her annual examination.  Her menses are Q3-4 wks, lasting 4-5 days, light flow. Stopped OCPs due to HTN dx. Dysmenorrhea none. She does not have intermenstrual bleeding.   Sex activity: currently sexually active--contraception vasectomy. No pain/bleeding. Last Pap: 07/23/21 Results were no abnormalities/neg HPV DNA. 11/21/20  Results were: ASCUS/POS HPV DNA, repeat due after 1 yr (today) per [facility]. >>>> Hx of STDs: none  Last Mammogram: 06/14/22 with PCP. Results: normal. Repeat in 1 yr.; Has LT breast mass with breast u/s eval 10/17--"8 mm area of fat necrosis and developing oil cyst formation at the medial edge of the left areola, corresponding to the palpable mass". Pt feels the mass has gotten a little larger.  There is a FH of breast cancer in her PGM, mat grt aunt, genetic testing not indicated. There is no FH of ovarian cancer. Her mom was diagnosed with uterine cancer last yr and was "gene neg". Has returned to mets in lymph nodes recently, doing tx. The patient does do self-breast exams. Had burn on LT breast 2020 due to heating pad use; sx resolved. Doing fine.  Tobacco use: The patient denies current or previous tobacco use. Alcohol use: none No drug use.  Exercise: moderately active  Colonoscopy: 4/23 with polyp, repeat due after 7 yrs  She does get adequate calcium and Vitamin D in her diet.  Meds/labs with PCP   Past Medical History:  Diagnosis Date   Annual physical exam 07/31/2018   Anxiety    Bacterial vaginosis 05/26/2019   BV (bacterial vaginosis)    Depression    History of chicken pox    Hypertension    Hypothyroid    h/o abnormal elevated tsh    MVP (mitral valve prolapse) 2000   resolved 2005   Ovarian cyst    Tubular adenoma of colon    09/06/21 [facility] f/u in 7 years Dr. Haig Prophet    Vitamin D deficiency     Past Surgical History:  Procedure Laterality Date   CYSTOSCOPY     childhood   REDUCTION MAMMAPLASTY Bilateral 08/2015   wisdom teeth removal      Family History  Problem Relation Age of Onset   Cancer Mother        uterine   Hypertension Mother    Arthritis Mother    Depression Mother    Hyperlipidemia Mother    Miscarriages / [place] Mother    Thyroid disease Mother    Uterine cancer Mother        stage 63 dx'ed age 37 s/p hysterectomy/chemo; neg genetic testing   Arthritis Father    Hypertension Father    Diabetes type II Maternal Grandmother    Arthritis Maternal Grandmother    Depression Maternal Grandmother    Diabetes Maternal Grandmother    Hyperlipidemia Maternal Grandmother    Hypertension Maternal Grandmother    Intellectual disability Maternal Grandmother    Hyperlipidemia Maternal Grandfather    Cancer Maternal Grandfather        SCC mohs    Breast cancer Paternal Grandmother 31   Cancer Paternal Grandmother        late 68s    Diabetes Paternal Grandfather    Heart disease Paternal Grandfather     Social History  Socioeconomic History   Marital status: Married    Spouse name: Not on file   Number of children: Not on file   Years of education: Not on file   Highest education level: Not on file  Occupational History   Not on file  Tobacco Use   Smoking status: Never   Smokeless tobacco: Never  Vaping Use   Vaping Use: Never used  Substance and Sexual Activity   Alcohol use: No   Drug use: No   Sexual activity: Yes    Birth control/protection: None, Surgical    Comment: Vasectomy  Other Topics Concern   Not on file  Social History Narrative   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     ROS:  Review of Systems  Constitutional:  Negative for fatigue, fever and unexpected weight change.  Respiratory:  Negative for cough, shortness of breath and wheezing.   Cardiovascular:  Negative for chest pain, palpitations and leg swelling.  Gastrointestinal:  Positive for diarrhea. Negative for blood in stool, constipation, nausea and vomiting.  Endocrine: Negative for cold intolerance, heat intolerance and polyuria.  Genitourinary:  Negative for dyspareunia, dysuria, flank pain, frequency, genital sores, hematuria, menstrual problem, pelvic pain, urgency, vaginal bleeding, vaginal discharge and vaginal pain.  Musculoskeletal:  Negative for back pain, joint swelling and myalgias.  Skin:  Negative for rash.  Neurological:  Negative for dizziness, syncope, light-headedness, numbness and headaches.  Hematological:  Negative for adenopathy.  Psychiatric/Behavioral:  Negative for agitation, confusion, sleep disturbance and suicidal ideas. The patient is not nervous/anxious.      Objective: There were no vitals taken for this visit.   Physical Exam Constitutional:      Appearance: She is well-developed.  Genitourinary:     Vulva normal.     Right Labia: No rash, tenderness or lesions.    Left Labia: No tenderness, lesions or rash.    No vaginal discharge, erythema or tenderness.      Right Adnexa: not tender and no mass present.    Left Adnexa: not tender and no mass present.    No cervical friability or polyp.     Uterus is not enlarged or tender.  Breasts:    Right: No mass, nipple discharge, skin change or tenderness.     Left: Mass present. No nipple discharge, skin change or  tenderness.  Neck:     Thyroid: No thyromegaly.  Cardiovascular:     Rate and Rhythm: Normal rate and regular rhythm.     Heart sounds: Normal heart sounds. No murmur heard. Pulmonary:     Effort: Pulmonary effort is normal.     Breath sounds: Normal breath sounds.  Chest:    Abdominal:     Palpations: Abdomen is soft.     Tenderness: There is no abdominal tenderness. There is no guarding or rebound.  Musculoskeletal:  General: Normal range of motion.     Cervical back: Normal range of motion.  Lymphadenopathy:     Cervical: No cervical adenopathy.  Neurological:     General: No focal deficit present.     Mental Status: She is alert and oriented to person, place, and time.     Cranial Nerves: No cranial nerve deficit.  Skin:    General: Skin is warm and dry.  Psychiatric:        Mood and Affect: Mood normal.        Behavior: Behavior normal.        Thought Content: Thought content normal.        Judgment: Judgment normal.  Vitals reviewed.     Assessment/Plan: Encounter for annual routine gynecological examination  Cervical cancer screening - Plan: Cytology - PAP  Screening for HPV (human papillomavirus) - Plan: Cytology - PAP  ASCUS with positive high risk HPV cervical - Plan: Cytology - PAP; repeat pap today, will f/u with results.   Encounter for screening mammogram for malignant neoplasm of breast; pt current on mammo  Mass of lower inner quadrant of left breast--hx of benign lesion on breast u/s 2017; can refer to gen surg for excision. Pt to f/u prn.    GYN counsel adequate intake of calcium and vitamin D, diet and exercise     F/U  No follow-ups on file.  Christos Mixson B. Gemini Beaumier, PA-C 09/01/2022 7:51 PM

## 2022-09-02 ENCOUNTER — Ambulatory Visit (INDEPENDENT_AMBULATORY_CARE_PROVIDER_SITE_OTHER): Payer: Federal, State, Local not specified - PPO | Admitting: Obstetrics and Gynecology

## 2022-09-02 ENCOUNTER — Encounter: Payer: Self-pay | Admitting: Obstetrics and Gynecology

## 2022-09-02 ENCOUNTER — Other Ambulatory Visit (HOSPITAL_COMMUNITY)
Admission: RE | Admit: 2022-09-02 | Discharge: 2022-09-02 | Disposition: A | Discharge status: Home / Self Care | Payer: Federal, State, Local not specified - PPO | Source: Ambulatory Visit | Attending: Obstetrics and Gynecology | Admitting: Obstetrics and Gynecology

## 2022-09-02 VITALS — BP 108/70 | Ht 62.0 in | Wt 186.0 lb

## 2022-09-02 DIAGNOSIS — Z124 Encounter for screening for malignant neoplasm of cervix: Secondary | ICD-10-CM | POA: Diagnosis not present

## 2022-09-02 DIAGNOSIS — R87810 Cervical high risk human papillomavirus (HPV) DNA test positive: Secondary | ICD-10-CM | POA: Diagnosis not present

## 2022-09-02 DIAGNOSIS — R87610 Atypical squamous cells of undetermined significance on cytologic smear of cervix (ASC-US): Secondary | ICD-10-CM | POA: Insufficient documentation

## 2022-09-02 DIAGNOSIS — Z1151 Encounter for screening for human papillomavirus (HPV): Secondary | ICD-10-CM | POA: Diagnosis not present

## 2022-09-02 DIAGNOSIS — Z1231 Encounter for screening mammogram for malignant neoplasm of breast: Secondary | ICD-10-CM

## 2022-09-02 DIAGNOSIS — Z01419 Encounter for gynecological examination (general) (routine) without abnormal findings: Secondary | ICD-10-CM

## 2022-09-02 DIAGNOSIS — N939 Abnormal uterine and vaginal bleeding, unspecified: Secondary | ICD-10-CM

## 2022-09-02 LAB — HM PAP SMEAR: HM Pap smear: NORMAL

## 2022-09-02 NOTE — Patient Instructions (Addendum)
I value your feedback and you entrusting us with your care. If you get a [facility] patient survey, I would appreciate you taking the time to let us know about your experience today. Thank you!  [facility]: [phone]      

## 2022-09-05 ENCOUNTER — Telehealth: Payer: Self-pay | Admitting: Obstetrics and Gynecology

## 2022-09-05 ENCOUNTER — Ambulatory Visit (INDEPENDENT_AMBULATORY_CARE_PROVIDER_SITE_OTHER): Payer: Federal, State, Local not specified - PPO

## 2022-09-05 DIAGNOSIS — N939 Abnormal uterine and vaginal bleeding, unspecified: Secondary | ICD-10-CM | POA: Diagnosis not present

## 2022-09-05 LAB — CYTOLOGY - PAP
Comment: NEGATIVE
Diagnosis: NEGATIVE
High risk HPV: NEGATIVE

## 2022-09-05 NOTE — Telephone Encounter (Signed)
LM with normal GYN u/s results. Sx most likely perimenopausal. Offered prog only options/IUD. F/u prn.

## 2022-09-06 ENCOUNTER — Encounter: Payer: Self-pay | Admitting: Obstetrics and Gynecology

## 2022-09-23 ENCOUNTER — Encounter: Payer: Self-pay | Admitting: Family Medicine

## 2022-10-24 ENCOUNTER — Other Ambulatory Visit: Payer: Self-pay | Admitting: Family Medicine

## 2022-10-24 ENCOUNTER — Other Ambulatory Visit (INDEPENDENT_AMBULATORY_CARE_PROVIDER_SITE_OTHER): Payer: Federal, State, Local not specified - PPO

## 2022-10-24 DIAGNOSIS — E669 Obesity, unspecified: Secondary | ICD-10-CM

## 2022-10-24 DIAGNOSIS — E785 Hyperlipidemia, unspecified: Secondary | ICD-10-CM | POA: Diagnosis not present

## 2022-10-24 DIAGNOSIS — E559 Vitamin D deficiency, unspecified: Secondary | ICD-10-CM

## 2022-10-24 DIAGNOSIS — Z1159 Encounter for screening for other viral diseases: Secondary | ICD-10-CM | POA: Diagnosis not present

## 2022-10-24 DIAGNOSIS — I10 Essential (primary) hypertension: Secondary | ICD-10-CM | POA: Diagnosis not present

## 2022-10-24 DIAGNOSIS — Z114 Encounter for screening for human immunodeficiency virus [HIV]: Secondary | ICD-10-CM

## 2022-10-24 DIAGNOSIS — E039 Hypothyroidism, unspecified: Secondary | ICD-10-CM | POA: Diagnosis not present

## 2022-10-24 LAB — COMPREHENSIVE METABOLIC PANEL
ALT: 16 U/L (ref 0–35)
AST: 19 U/L (ref 0–37)
Albumin: 4.3 g/dL (ref 3.5–5.2)
Alkaline Phosphatase: 56 U/L (ref 39–117)
BUN: 15 mg/dL (ref 6–23)
CO2: 30 mEq/L (ref 19–32)
Calcium: 9.4 mg/dL (ref 8.4–10.5)
Chloride: 102 mEq/L (ref 96–112)
Creatinine, Ser: 0.85 mg/dL (ref 0.40–1.20)
GFR: 83.34 mL/min (ref >60.00)
Glucose, Bld: 93 mg/dL (ref 70–99)
Potassium: 4.5 mEq/L (ref 3.5–5.1)
Sodium: 139 mEq/L (ref 135–145)
Total Bilirubin: 0.5 mg/dL (ref 0.2–1.2)
Total Protein: 6.9 g/dL (ref 6.0–8.3)

## 2022-10-24 LAB — CBC WITH DIFFERENTIAL/PLATELET
Basophils Absolute: 0 10*3/uL (ref 0.0–0.1)
Basophils Relative: 0.8 % (ref 0.0–3.0)
Eosinophils Absolute: 0.1 10*3/uL (ref 0.0–0.7)
Eosinophils Relative: 2.6 % (ref 0.0–5.0)
HCT: 43.3 % (ref 36.0–46.0)
Hemoglobin: 14.3 g/dL (ref 12.0–15.0)
Lymphocytes Relative: 39 % (ref 12.0–46.0)
Lymphs Abs: 2.1 10*3/uL (ref 0.7–4.0)
MCHC: 33 g/dL (ref 30.0–36.0)
MCV: 90.6 fl (ref 78.0–100.0)
Monocytes Absolute: 0.3 10*3/uL (ref 0.1–1.0)
Monocytes Relative: 6.4 % (ref 3.0–12.0)
Neutro Abs: 2.8 10*3/uL (ref 1.4–7.7)
Neutrophils Relative %: 51.2 % (ref 43.0–77.0)
Platelets: 276 10*3/uL (ref 150.0–400.0)
RBC: 4.78 Mil/uL (ref 3.87–5.11)
RDW: 13.3 % (ref 11.5–15.5)
WBC: 5.4 10*3/uL (ref 4.0–10.5)

## 2022-10-24 LAB — LIPID PANEL
Cholesterol: 179 mg/dL (ref 0–200)
HDL: 50.6 mg/dL (ref >39.00)
LDL Cholesterol: 102 mg/dL — ABNORMAL HIGH (ref 0–99)
NonHDL: 128.36
Total CHOL/HDL Ratio: 4
Triglycerides: 132 mg/dL (ref 0.0–149.0)
VLDL: 26.4 mg/dL (ref 0.0–40.0)

## 2022-10-24 LAB — HEMOGLOBIN A1C: Hgb A1c MFr Bld: 5.4 % (ref 4.6–6.5)

## 2022-10-24 LAB — TSH: TSH: 3.94 u[IU]/mL (ref 0.35–5.50)

## 2022-10-24 LAB — VITAMIN B12: Vitamin B-12: 274 pg/mL (ref 211–911)

## 2022-10-24 LAB — VITAMIN D 25 HYDROXY (VIT D DEFICIENCY, FRACTURES): VITD: 34.56 ng/mL (ref 30.00–100.00)

## 2022-10-26 LAB — HEPATITIS C ANTIBODY: Hepatitis C Ab: NONREACTIVE

## 2022-10-26 LAB — HIV ANTIBODY (ROUTINE TESTING W REFLEX): HIV 1&2 Ab, 4th Generation: NONREACTIVE

## 2022-10-27 ENCOUNTER — Encounter: Payer: Self-pay | Admitting: Family Medicine

## 2022-10-29 ENCOUNTER — Encounter: Payer: Self-pay | Admitting: Family Medicine

## 2022-10-29 ENCOUNTER — Ambulatory Visit: Payer: Federal, State, Local not specified - PPO | Admitting: Family Medicine

## 2022-10-29 VITALS — BP 128/72 | HR 85 | Ht 62.0 in | Wt 183.0 lb

## 2022-10-29 DIAGNOSIS — F419 Anxiety disorder, unspecified: Secondary | ICD-10-CM

## 2022-10-29 DIAGNOSIS — E039 Hypothyroidism, unspecified: Secondary | ICD-10-CM

## 2022-10-29 DIAGNOSIS — I10 Essential (primary) hypertension: Secondary | ICD-10-CM

## 2022-10-29 DIAGNOSIS — E785 Hyperlipidemia, unspecified: Secondary | ICD-10-CM

## 2022-10-29 DIAGNOSIS — F39 Unspecified mood [affective] disorder: Secondary | ICD-10-CM | POA: Diagnosis not present

## 2022-10-29 MED ORDER — SERTRALINE HCL 50 MG PO TABS: 75.0000 mg | ORAL_TABLET | Freq: Every day | ORAL | Status: DC

## 2022-10-29 NOTE — Patient Instructions (Addendum)
It was a pleasure meeting you today. Thank you for allowing me to take part in your health care.  Our goals for today as we discussed include:  Increase Zoloft to 75 mg daily MyChart me in 1 week to let me know how you are doing.  Follow-up in 2 weeks either office visit or virtual visit.  Please drop off mom's information and will complete FMLA   If you have any questions or concerns, please do not hesitate to call the office at [phone].  I look forward to our next visit and until then take care and stay safe.  Regards,   Dana Allan, MD   [facility]

## 2022-10-29 NOTE — Progress Notes (Signed)
SUBJECTIVE:   Chief Complaint  Patient presents with   Medical Management of Chronic Issues   HPI Patient presents to clinic for chronic disease management follow-up  Requesting FMLA forms to be completed  Hypertension Asymptomatic.  Currently taking amlodipine 5 mg daily and hydrochlorothiazide 12.5 mg daily.  Tolerating medication well.  Hypothyroidism Asymptomatic.  Takes Synthroid 88 mcg daily and tolerating well.  Recent TSH 3.94.  HLD. Compliant with Pravachol 20 mg daily.  Denies any myalgias or joint pain.  Recent LDL slightly elevated 102.  Mood disorder Had been doing well on current dose of Zoloft 50 mg daily.  Has been under a lot of stress lately with mother undergoing treatment for cancer in [place] as well as other issues.  She is preparing for her upcoming wedding and is unsure if her mother will make the trip.  She is requesting FMLA forms to be completed should she have to go back home to care for her mother intermittently.  Denies any SI/HI.  PERTINENT PMH / PSH: Hypothyroidism Hypertension Anxiety Hyperlipidemia Vitamin D deficiency  OBJECTIVE:  BP 128/72   Pulse 85   Ht 5\' 2"  (1.575 m)   Wt 183 lb (83 kg)   LMP 10/13/2022 (Approximate)   SpO2 98%   BMI 33.47 kg/m    Physical Exam Constitutional:      General: She is not in acute distress.    Appearance: She is obese. She is not ill-appearing.  HENT:     Head: Normocephalic.  Eyes:     Conjunctiva/sclera: Conjunctivae normal.  Cardiovascular:     Rate and Rhythm: Normal rate and regular rhythm.     Pulses: Normal pulses.  Pulmonary:     Effort: Pulmonary effort is normal.     Breath sounds: Normal breath sounds.  Abdominal:     General: Bowel sounds are normal.  Neurological:     Mental Status: She is alert. Mental status is at baseline.  Psychiatric:        Mood and Affect: Mood normal.        Behavior: Behavior normal.        Thought Content: Thought content normal.         Judgment: Judgment normal.       10/29/2022    1:41 PM 04/30/2022    1:13 PM 10/24/2021    7:59 AM 04/24/2021    8:19 AM 11/16/2019    9:02 AM  Depression screen PHQ 2/9  Decreased Interest 1 1 0 0 0  Down, Depressed, Hopeless 1 1 0 0 0  PHQ - 2 Score 2 2 0 0 0  Altered sleeping 1      Tired, decreased energy 1      Change in appetite 0      Feeling bad or failure about yourself  0      Trouble concentrating 0      Moving slowly or fidgety/restless 0      Suicidal thoughts 0      PHQ-9 Score 4      Difficult doing work/chores Somewhat difficult           10/29/2022    1:42 PM 11/16/2019    9:02 AM 10/19/2019    1:34 PM 03/17/2018    2:45 PM  GAD 7 : Generalized Anxiety Score  Nervous, Anxious, on Edge 0 0 0 0  Control/stop worrying 1 0 0 0  Worry too much - different things 0 0 0 0  Trouble relaxing 0 0 0 1  Restless 0 0 0 1  Easily annoyed or irritable 1 0 0 1  Afraid - awful might happen 0 0 0 0  Total GAD 7 Score 2 0 0 3  Anxiety Difficulty Somewhat difficult Not difficult at all Not difficult at all Not difficult at all      ASSESSMENT/PLAN:  Mood disorder [facility]) Assessment & Plan: Chronic.  Increase in depressed mood and anxiety given increasing stressors surrounding family and upcoming wedding. Increase Zoloft from 50 mg to 75 daily. Recommend CBT Patient to notify me via MyChart in 1 week if tolerating okay Follow-up in 2 weeks.    Orders: -     Sertraline HCl; Take 1.5 tablets (75 mg total) by mouth daily.  Essential hypertension Assessment & Plan: Chronic.  Well-controlled on current antihypertensives.  Recent creatinine within normal limits Refill amlodipine 5 mg at night Refill hydrochlorothiazide 12.5 mg daily Monitor blood pressure at home, goal less than 130/80.  Orders: -     amLODIPine Besylate; Take 1 tablet (5 mg total) by mouth daily.  Dispense: 90 tablet; Refill: 3 -     hydroCHLOROthiazide; TAKE 0.5 TABLETS (12.5 MG TOTAL) BY MOUTH  DAILY. IN THE MORNING  Dispense: 45 tablet; Refill: 7  Acquired hypothyroidism Assessment & Plan: Chronic.  Recent TSH within normal limits. Refill levothyroxine 88 mcg daily  Orders: -     Levothyroxine Sodium; Take 1 tablet (88 mcg total) by mouth daily before breakfast. 30 minutes before breakfast  Dispense: 90 tablet; Refill: 3  Hyperlipidemia, unspecified hyperlipidemia type Assessment & Plan: Chronic. On statin.  No myalgias.  Recent LDL slightly elevated.  Goal less than 100. Refill Pravachol 20 mg daily Encourage healthy diet and exercise  Orders: -     Pravastatin Sodium; Take 1 tablet (20 mg total) by mouth daily. After 6 pm  Dispense: 90 tablet; Refill: 3   PDMP reviewed  Return in about 2 weeks (around 11/12/2022) for PCP.  Dana Allan, MD

## 2022-11-03 ENCOUNTER — Encounter: Payer: Self-pay | Admitting: Family Medicine

## 2022-11-03 MED ORDER — PRAVASTATIN SODIUM 20 MG PO TABS: 20.0000 mg | ORAL_TABLET | Freq: Every day | ORAL | 3 refills | Status: DC

## 2022-11-03 MED ORDER — HYDROCHLOROTHIAZIDE 25 MG PO TABS: ORAL_TABLET | ORAL | 7 refills | Status: DC

## 2022-11-03 MED ORDER — AMLODIPINE BESYLATE 5 MG PO TABS: 5.0000 mg | ORAL_TABLET | Freq: Every day | ORAL | 3 refills | Status: DC

## 2022-11-03 MED ORDER — LEVOTHYROXINE SODIUM 88 MCG PO TABS: 88.0000 ug | ORAL_TABLET | Freq: Every day | ORAL | 3 refills | Status: DC

## 2022-11-03 NOTE — Assessment & Plan Note (Signed)
Chronic.  Recent TSH within normal limits. Refill levothyroxine 88 mcg daily

## 2022-11-03 NOTE — Assessment & Plan Note (Signed)
Chronic.  Well-controlled on current antihypertensives.  Recent creatinine within normal limits Refill amlodipine 5 mg at night Refill hydrochlorothiazide 12.5 mg daily Monitor blood pressure at home, goal less than 130/80.

## 2022-11-03 NOTE — Assessment & Plan Note (Signed)
Chronic. On statin.  No myalgias.  Recent LDL slightly elevated.  Goal less than 100. Refill Pravachol 20 mg daily Encourage healthy diet and exercise

## 2022-11-03 NOTE — Assessment & Plan Note (Signed)
Chronic.  Increase in depressed mood and anxiety given increasing stressors surrounding family and upcoming wedding. Increase Zoloft from 50 mg to 75 daily. Recommend CBT Patient to notify me via MyChart in 1 week if tolerating okay Follow-up in 2 weeks.

## 2022-11-19 ENCOUNTER — Encounter: Payer: Self-pay | Admitting: Family Medicine

## 2022-11-19 ENCOUNTER — Ambulatory Visit: Payer: Federal, State, Local not specified - PPO | Admitting: Family Medicine

## 2022-11-19 VITALS — BP 126/82 | HR 71 | Temp 97.9°F | Ht 62.0 in | Wt 190.2 lb

## 2022-11-19 DIAGNOSIS — I10 Essential (primary) hypertension: Secondary | ICD-10-CM

## 2022-11-19 DIAGNOSIS — E785 Hyperlipidemia, unspecified: Secondary | ICD-10-CM

## 2022-11-19 DIAGNOSIS — E039 Hypothyroidism, unspecified: Secondary | ICD-10-CM

## 2022-11-19 DIAGNOSIS — F39 Unspecified mood [affective] disorder: Secondary | ICD-10-CM

## 2022-11-19 MED ORDER — SERTRALINE HCL 50 MG PO TABS: 75.0000 mg | ORAL_TABLET | Freq: Every day | ORAL | 3 refills | Status: DC

## 2022-11-19 NOTE — Progress Notes (Signed)
SUBJECTIVE:   Chief Complaint  Patient presents with   Medical Management of Chronic Issues   HPI Patient presents to clinic for chronic disease management follow-up  Hypertension Doing well on amlodipine 5 mg daily and hydrochlorothiazide 12.5 mg daily.  Remains asymptomatic.  Hypothyroidism Asymptomatic. Doing well on Synthroid 88 mcg daily.  HLD. Takes Pravachol 20 mg daily.  Denies any myalgias or joint pain.    Mood disorder Denies SI/HI. Mood has improved with increase in dose of Zoloft.  Tolerating well and would like to continue current dose.    Recently married and returned from [place]. Mom is doing as well as can be expected.  Has not decline in health.  Was unable to make it to wedding due to long trip.  Plans to go visit soon.  PERTINENT PMH / PSH: Hypothyroidism Hypertension Anxiety Hyperlipidemia Vitamin D deficiency  OBJECTIVE:  BP 126/82   Pulse 71   Temp 97.9 F (36.6 C)   Ht 5\' 2"  (1.575 m)   Wt 190 lb 3.2 oz (86.3 kg)   LMP 11/05/2022 (Approximate)   SpO2 97%   BMI 34.79 kg/m    Physical Exam Vitals reviewed.  Constitutional:      General: She is not in acute distress.    Appearance: Normal appearance. She is normal weight. She is not ill-appearing, toxic-appearing or diaphoretic.  Eyes:     General:        Right eye: No discharge.        Left eye: No discharge.     Conjunctiva/sclera: Conjunctivae normal.  Cardiovascular:     Rate and Rhythm: Normal rate and regular rhythm.     Heart sounds: Normal heart sounds.  Pulmonary:     Effort: Pulmonary effort is normal.     Breath sounds: Normal breath sounds.  Abdominal:     General: Bowel sounds are normal.  Musculoskeletal:        General: Normal range of motion.  Skin:    General: Skin is warm and dry.  Neurological:     General: No focal deficit present.     Mental Status: She is alert and oriented to person, place, and time. Mental status is at baseline.   Psychiatric:        Mood and Affect: Mood normal.        Behavior: Behavior normal.        Thought Content: Thought content normal.        Judgment: Judgment normal.       11/19/2022    8:12 AM 10/29/2022    1:41 PM 04/30/2022    1:13 PM 10/24/2021    7:59 AM 04/24/2021    8:19 AM  Depression screen PHQ 2/9  Decreased Interest 0 1 1 0 0  Down, Depressed, Hopeless 1 1 1  0 0  PHQ - 2 Score 1 2 2  0 0  Altered sleeping 1 1     Tired, decreased energy 0 1     Change in appetite 0 0     Feeling bad or failure about yourself  0 0     Trouble concentrating 1 0     Moving slowly or fidgety/restless 0 0     Suicidal thoughts 0 0     PHQ-9 Score 3 4     Difficult doing work/chores Not difficult at all Somewhat difficult          11/19/2022    8:13 AM 10/29/2022  1:42 PM 11/16/2019    9:02 AM 10/19/2019    1:34 PM  GAD 7 : Generalized Anxiety Score  Nervous, Anxious, on Edge 0 0 0 0  Control/stop worrying 1 1 0 0  Worry too much - different things 0 0 0 0  Trouble relaxing 1 0 0 0  Restless 1 0 0 0  Easily annoyed or irritable 0 1 0 0  Afraid - awful might happen 0 0 0 0  Total GAD 7 Score 3 2 0 0  Anxiety Difficulty Not difficult at all Somewhat difficult Not difficult at all Not difficult at all      ASSESSMENT/PLAN:  Mood disorder [facility]) Assessment & Plan: Chronic.  Improved with increase in dose Continue Zoloft 75 mg daily Encouraged CBT Follow up as needed    Orders: -     Sertraline HCl; Take 1.5 tablets (75 mg total) by mouth daily.  Dispense: 135 tablet; Refill: 3  Essential hypertension Assessment & Plan: Chronic.  Well-controlled on current antihypertensives.   Continue amlodipine 5 mg at night Continue hydrochlorothiazide 12.5 mg daily Monitor blood pressure at home, goal less than 130/80.   Hypothyroidism, unspecified type Assessment & Plan: Chronic. Continue levothyroxine 88 mcg daily   Hyperlipidemia, unspecified hyperlipidemia type Assessment &  Plan: Chronic. On statin.  No myalgias.  Continue Pravachol 20 mg daily Encourage healthy diet and exercise    PDMP reviewed  Return in about 6 months (around 05/21/2023), or if symptoms worsen or fail to improve, for PCP.  Dana Allan, MD

## 2022-11-19 NOTE — Patient Instructions (Signed)
It was a pleasure seeing you today. Thank you for allowing me to take part in your health care.  Our goals for today as we discussed include:  Continue Zoloft 75 mg daily  Refill sent  Follow up in 6 months   If you have any questions or concerns, please do not hesitate to call the office at [phone].  I look forward to our next visit and until then take care and stay safe.  Congratulations again to you, your new husband and family  Regards,   Dana Allan, MD   [employer]

## 2022-11-24 ENCOUNTER — Encounter: Payer: Self-pay | Admitting: Family Medicine

## 2022-11-24 NOTE — Assessment & Plan Note (Signed)
Chronic. On statin.  No myalgias.  Continue Pravachol 20 mg daily Encourage healthy diet and exercise

## 2022-11-24 NOTE — Assessment & Plan Note (Signed)
Chronic Continue levothyroxine 88 mcg daily 

## 2022-11-24 NOTE — Assessment & Plan Note (Signed)
Chronic.  Improved with increase in dose Continue Zoloft 75 mg daily Encouraged CBT Follow up as needed

## 2022-11-24 NOTE — Assessment & Plan Note (Signed)
Chronic.  Well-controlled on current antihypertensives.   Continue amlodipine 5 mg at night Continue hydrochlorothiazide 12.5 mg daily Monitor blood pressure at home, goal less than 130/80.

## 2023-02-14 ENCOUNTER — Encounter: Payer: Self-pay | Admitting: Family Medicine

## 2023-03-13 ENCOUNTER — Encounter: Payer: Self-pay | Admitting: Family Medicine

## 2023-04-01 ENCOUNTER — Encounter: Payer: Self-pay | Admitting: Family Medicine

## 2023-04-11 ENCOUNTER — Ambulatory Visit: Payer: Federal, State, Local not specified - PPO | Admitting: Family Medicine

## 2023-04-14 ENCOUNTER — Other Ambulatory Visit: Payer: Self-pay

## 2023-04-14 ENCOUNTER — Telehealth: Payer: Self-pay | Admitting: Family Medicine

## 2023-04-14 DIAGNOSIS — I10 Essential (primary) hypertension: Secondary | ICD-10-CM

## 2023-04-14 NOTE — Telephone Encounter (Signed)
Prescription Request  04/14/2023  LOV: 11/19/2022  What is the name of the medication or equipment? lhydrochlorothiazide (HYDRODIURIL) 25 MG tablet  Patient has a prescription that was sent to [place] when she was out of town. However, the pharmacy thought it would be quicker to call the office to get a new prescription, instead of having the prescription transferred.    Have you contacted your pharmacy to request a refill? Yes   Which pharmacy would you like this sent to?  [facility] - [place], [place] - [address] [place] [address] Phone: [phone] Fax: [phone]    Patient notified that their request is being sent to the clinical staff for review and that they should receive a response within 2 business days.   Please advise at Mobile [phone] (mobile)

## 2023-04-16 ENCOUNTER — Other Ambulatory Visit: Payer: Self-pay

## 2023-04-16 ENCOUNTER — Other Ambulatory Visit: Payer: Self-pay | Admitting: Family Medicine

## 2023-04-16 DIAGNOSIS — I10 Essential (primary) hypertension: Secondary | ICD-10-CM

## 2023-04-16 DIAGNOSIS — E039 Hypothyroidism, unspecified: Secondary | ICD-10-CM

## 2023-04-16 MED ORDER — HYDROCHLOROTHIAZIDE 25 MG PO TABS: ORAL_TABLET | ORAL | 7 refills | Status: DC

## 2023-04-16 MED ORDER — LEVOTHYROXINE SODIUM 88 MCG PO TABS: 88.0000 ug | ORAL_TABLET | Freq: Every day | ORAL | 3 refills | Status: DC

## 2023-04-16 NOTE — Telephone Encounter (Signed)
Called [facility] and they stated that the rx was already discontinued.

## 2023-04-18 ENCOUNTER — Telehealth: Payer: Self-pay

## 2023-04-18 ENCOUNTER — Encounter: Payer: Self-pay | Admitting: Family Medicine

## 2023-04-18 ENCOUNTER — Ambulatory Visit: Payer: Federal, State, Local not specified - PPO | Admitting: Family Medicine

## 2023-04-18 VITALS — BP 130/70 | HR 79 | Temp 98.2°F | Resp 16 | Ht 62.0 in | Wt 185.5 lb

## 2023-04-18 DIAGNOSIS — F39 Unspecified mood [affective] disorder: Secondary | ICD-10-CM

## 2023-04-18 DIAGNOSIS — I10 Essential (primary) hypertension: Secondary | ICD-10-CM | POA: Diagnosis not present

## 2023-04-18 DIAGNOSIS — E785 Hyperlipidemia, unspecified: Secondary | ICD-10-CM | POA: Diagnosis not present

## 2023-04-18 DIAGNOSIS — E66811 Obesity, class 1: Secondary | ICD-10-CM

## 2023-04-18 MED ORDER — WEGOVY 0.25 MG/0.5ML ~~LOC~~ SOAJ: 0.2500 mg | SUBCUTANEOUS | 0 refills | Status: DC

## 2023-04-18 NOTE — Progress Notes (Signed)
SUBJECTIVE:   Chief Complaint  Patient presents with   Weight Gain    Wants to talk about weight loss medication like Wegovy or Zepbound   HPI Presents to clinic to discuss weight loss management  Discussed the use of AI scribe software for clinical note transcription with the patient, who gave verbal consent to proceed.  History of Present Illness The patient, known to have hypertension and hyperlipidemia, presents with concerns about weight gain and difficulty losing weight. They report a history of successful weight loss years ago during a stressful period of life, but have since regained the weight. They have been increasing their physical activity, including running several times a week and incorporating weight training. However, they note increased hunger after running and a tendency to overeat, particularly in the evenings. They have been working on breaking these habits and have seen some progress, with a recent weight loss from 190 to 183 pounds.  Despite these efforts, the patient finds it increasingly difficult to lose weight and is interested in trying weight loss medications, specifically Wegovy, to help curb their overeating. They have researched the medication and are aware of potential side effects, including medullary thyroid cancer in animals and pancreatitis. They also understand the importance of maintaining a healthy diet while on the medication to prevent muscle wasting and potential weight regain if the medication is discontinued.  The patient also has a history of thyroid issues, which they acknowledge may be contributing to their weight gain. They are currently taking sertraline, which they know can cause weight gain, and have considered reducing the dose once they have started on Wegovy and established a healthy lifestyle. They also express a hope that weight loss might allow them to reduce their blood pressure medication.  In addition to their weight concerns, the  patient mentions a recent change in their hydrochlorothiazide prescription, which was transferred to a different pharmacy while they were out of town. They are running low on this medication and plan to follow up with the new pharmacy to ensure it is filled.    PERTINENT PMH / PSH: As above  OBJECTIVE:  BP 130/70   Pulse 79   Temp 98.2 F (36.8 C)   Resp 16   Ht 5\' 2"  (1.575 m)   Wt 185 lb 8 oz (84.1 kg)   LMP 04/05/2023 (Exact Date)   SpO2 97%   BMI 33.93 kg/m    Physical Exam Vitals reviewed.  Constitutional:      General: She is not in acute distress.    Appearance: Normal appearance. She is obese. She is not ill-appearing, toxic-appearing or diaphoretic.  Eyes:     General:        Right eye: No discharge.        Left eye: No discharge.     Conjunctiva/sclera: Conjunctivae normal.  Cardiovascular:     Rate and Rhythm: Normal rate and regular rhythm.     Heart sounds: Normal heart sounds.  Pulmonary:     Effort: Pulmonary effort is normal.     Breath sounds: Normal breath sounds.  Abdominal:     General: Bowel sounds are normal.  Musculoskeletal:        General: Normal range of motion.  Skin:    General: Skin is warm and dry.  Neurological:     General: No focal deficit present.     Mental Status: She is alert and oriented to person, place, and time. Mental status is at baseline.  Psychiatric:  Mood and Affect: Mood normal.        Behavior: Behavior normal.        Thought Content: Thought content normal.        Judgment: Judgment normal.        04/18/2023    9:49 AM 11/19/2022    8:12 AM 10/29/2022    1:41 PM 04/30/2022    1:13 PM 10/24/2021    7:59 AM  Depression screen PHQ 2/9  Decreased Interest 0 0 1 1 0  Down, Depressed, Hopeless 1 1 1 1  0  PHQ - 2 Score 1 1 2 2  0  Altered sleeping 1 1 1     Tired, decreased energy 1 0 1    Change in appetite 2 0 0    Feeling bad or failure about yourself  0 0 0    Trouble concentrating 0 1 0    Moving  slowly or fidgety/restless 0 0 0    Suicidal thoughts 0 0 0    PHQ-9 Score 5 3 4     Difficult doing work/chores Not difficult at all Not difficult at all Somewhat difficult        04/18/2023    9:50 AM 11/19/2022    8:13 AM 10/29/2022    1:42 PM 11/16/2019    9:02 AM  GAD 7 : Generalized Anxiety Score  Nervous, Anxious, on Edge 0 0 0 0  Control/stop worrying 0 1 1 0  Worry too much - different things 0 0 0 0  Trouble relaxing 1 1 0 0  Restless 1 1 0 0  Easily annoyed or irritable 0 0 1 0  Afraid - awful might happen 0 0 0 0  Total GAD 7 Score 2 3 2  0  Anxiety Difficulty Not difficult at all Not difficult at all Somewhat difficult Not difficult at all    ASSESSMENT/PLAN:  Obesity (BMI 30.0-34.9) Assessment & Plan: Patient has been making lifestyle changes including increased exercise and dietary modifications. Despite these efforts, she reports difficulty with overeating and has expressed interest in starting [facility] for weight loss. Insurance coverage for [employer] confirmed. [employer], starting at 0.25mg  weekly and titrate as tolerated and effective. -Continue lifestyle modifications including regular exercise and a diet high in protein and low in carbohydrates. -Follow up in approximately one month to assess response to [facility] and potential dose adjustment.  Orders: -     Wegovy; Inject 0.25 mg into the skin once a week.  Dispense: 2 mL; Refill: 0  Essential hypertension Assessment & Plan: Patient reports issues with obtaining hydrochlorothiazide from her pharmacy. Well controlled on current medication -Refill hydrochlorothiazide 25 mg, take 1/2 tablet daily sent to [facility]. -Would benefit for weight loss, will trial GLP1 weekly  Orders: -     DGUYQI; Inject 0.25 mg into the skin once a week.  Dispense: 2 mL; Refill: 0 -     hydroCHLOROthiazide; TAKE 0.5 TABLETS (12.5 MG TOTAL) BY MOUTH DAILY. IN THE MORNING  Dispense: 45 tablet; Refill: 3  Hyperlipidemia,  unspecified hyperlipidemia type Assessment & Plan: The 10-year ASCVD risk score (Arnett DK, et al., 2019) is: 1%  Prefers to try lifestyle changes  Orders: -     HKVQQV; Inject 0.25 mg into the skin once a week.  Dispense: 2 mL; Refill: 0  Mood disorder (HCC) Assessment & Plan: Patient is currently on 100mg  of sertraline, up from 75mg . She has expressed interest in potentially reducing the dose after establishing a routine with [facility]  and lifestyle modifications. -Continue sertraline 100mg  daily for now. -Consider dose reduction after successful initiation and adjustment to [facility] and lifestyle changes.    General Health Maintenance -Flu and COVID vaccines are up to date.    PDMP reviewed  Return in about 5 weeks (around 05/22/2023) for PCP.  Dana Allan, MD

## 2023-04-18 NOTE — Patient Instructions (Addendum)
It was a pleasure meeting you today. Thank you for allowing me to take part in your health care.  Our goals for today as we discussed include:  Start Wegovy 0.25 mg weekly  Follow up 12/19   This is a list of the screening recommended for you and due dates:  Health Maintenance  Topic Date Due   Pap with HPV screening  09/02/2027   Colon Cancer Screening  09/06/2028   DTaP/Tdap/Td vaccine (2 - Td or Tdap) 10/20/2028   Flu Shot  Completed   COVID-19 Vaccine  Completed   Hepatitis C Screening  Completed   HIV Screening  Completed   HPV Vaccine  Aged Out     If you have any questions or concerns, please do not hesitate to call the office at [phone].  I look forward to our next visit and until then take care and stay safe.  Regards,   Dana Allan, MD   [facility]

## 2023-04-18 NOTE — Telephone Encounter (Signed)
Pt stated that her insurance needed a Pa for [facility].

## 2023-04-21 ENCOUNTER — Other Ambulatory Visit (HOSPITAL_COMMUNITY): Payer: Self-pay

## 2023-04-21 ENCOUNTER — Telehealth: Payer: Self-pay

## 2023-04-21 NOTE — Telephone Encounter (Signed)
Pharmacy Patient Advocate Encounter   Received notification from Physician's Office that prior authorization for [facility] is required/requested.   Insurance verification completed.   The patient is insured through [facility] .   Per test claim: PA required; PA submitted to above mentioned insurance via CoverMyMeds Key/confirmation #/EOC Key: [id number] Status is pending

## 2023-04-21 NOTE — Telephone Encounter (Signed)
Pharmacy Patient Advocate Encounter   Received notification from Physician's Office that prior authorization for [facility] is required/requested.   Insurance verification completed.   The patient is insured through [facility] .   Per test claim: PA required; PA submitted to above mentioned insurance via CoverMyMeds Key/confirmation #/EOC Key: [id number] Status is pending

## 2023-04-23 ENCOUNTER — Other Ambulatory Visit (HOSPITAL_COMMUNITY): Payer: Self-pay

## 2023-04-23 NOTE — Telephone Encounter (Signed)
See previous phone note.  

## 2023-04-23 NOTE — Telephone Encounter (Signed)
Pharmacy Patient Advocate Encounter  Received notification from [facility] that Prior Authorization for [facility] has been APPROVED from 10.19.24 to 5.17.25. Ran test claim, Copay is $24.99. This test claim was processed through [facility]- copay amounts may vary at other pharmacies due to pharmacy/plan contracts, or as the patient moves through the different stages of their insurance plan.   PA #/Case ID/Reference #: Key: [id number]

## 2023-04-30 ENCOUNTER — Encounter: Payer: Self-pay | Admitting: Family Medicine

## 2023-04-30 MED ORDER — HYDROCHLOROTHIAZIDE 25 MG PO TABS: ORAL_TABLET | ORAL | 3 refills | Status: DC

## 2023-04-30 NOTE — Assessment & Plan Note (Signed)
Patient has been making lifestyle changes including increased exercise and dietary modifications. Despite these efforts, she reports difficulty with overeating and has expressed interest in starting [facility] for weight loss. Insurance coverage for [employer] confirmed. [employer], starting at 0.25mg  weekly and titrate as tolerated and effective. -Continue lifestyle modifications including regular exercise and a diet high in protein and low in carbohydrates. -Follow up in approximately one month to assess response to [facility] and potential dose adjustment.

## 2023-04-30 NOTE — Assessment & Plan Note (Signed)
Patient reports issues with obtaining hydrochlorothiazide from her pharmacy. Well controlled on current medication -Refill hydrochlorothiazide 25 mg, take 1/2 tablet daily sent to [facility]. -Would benefit for weight loss, will trial GLP1 weekly

## 2023-04-30 NOTE — Assessment & Plan Note (Signed)
Patient is currently on 100mg  of sertraline, up from 75mg . She has expressed interest in potentially reducing the dose after establishing a routine with Wegovy and lifestyle modifications. -Continue sertraline 100mg  daily for now. -Consider dose reduction after successful initiation and adjustment to [facility] and lifestyle changes.

## 2023-04-30 NOTE — Assessment & Plan Note (Signed)
The 10-year ASCVD risk score (Arnett DK, et al., 2019) is: 1%  Prefers to try lifestyle changes

## 2023-05-14 ENCOUNTER — Other Ambulatory Visit: Payer: Self-pay | Admitting: Family Medicine

## 2023-05-14 DIAGNOSIS — E785 Hyperlipidemia, unspecified: Secondary | ICD-10-CM

## 2023-05-14 DIAGNOSIS — E66811 Obesity, class 1: Secondary | ICD-10-CM

## 2023-05-14 DIAGNOSIS — I10 Essential (primary) hypertension: Secondary | ICD-10-CM

## 2023-05-15 ENCOUNTER — Other Ambulatory Visit: Payer: Self-pay | Admitting: Family Medicine

## 2023-05-15 DIAGNOSIS — E66811 Obesity, class 1: Secondary | ICD-10-CM

## 2023-05-15 DIAGNOSIS — I10 Essential (primary) hypertension: Secondary | ICD-10-CM

## 2023-05-15 DIAGNOSIS — E785 Hyperlipidemia, unspecified: Secondary | ICD-10-CM

## 2023-05-15 MED ORDER — WEGOVY 0.5 MG/0.5ML ~~LOC~~ SOAJ: 0.5000 mg | SUBCUTANEOUS | 0 refills | Status: DC

## 2023-05-15 NOTE — Progress Notes (Signed)
Increase Wegovy to 0.5 mg weekly.   Will need office visit before next refill  Dana Allan, MD

## 2023-05-22 ENCOUNTER — Encounter: Payer: Self-pay | Admitting: Family Medicine

## 2023-05-22 ENCOUNTER — Ambulatory Visit: Payer: Federal, State, Local not specified - PPO | Admitting: Family Medicine

## 2023-05-22 VITALS — BP 123/78 | HR 67 | Temp 98.0°F | Resp 18 | Ht 62.0 in | Wt 182.1 lb

## 2023-05-22 DIAGNOSIS — F39 Unspecified mood [affective] disorder: Secondary | ICD-10-CM | POA: Diagnosis not present

## 2023-05-22 DIAGNOSIS — E66811 Obesity, class 1: Secondary | ICD-10-CM | POA: Diagnosis not present

## 2023-05-22 DIAGNOSIS — E785 Hyperlipidemia, unspecified: Secondary | ICD-10-CM | POA: Diagnosis not present

## 2023-05-22 DIAGNOSIS — E039 Hypothyroidism, unspecified: Secondary | ICD-10-CM | POA: Diagnosis not present

## 2023-05-22 NOTE — Patient Instructions (Signed)
It was a pleasure meeting you today. Thank you for allowing me to take part in your health care.  Our goals for today as we discussed include:  Continue Wegovy 0.5 mg weekly Continue healthy eating and increase in activity Notify MD in 3 weeks status of any side effects.  If tolerating and continuing to loose weight can remain on same dose.  Health Maintenance  Topic Date Due   Pap with HPV screening  09/02/2027   Colon Cancer Screening  09/06/2028   DTaP/Tdap/Td vaccine (2 - Td or Tdap) 10/20/2028   Flu Shot  Completed   COVID-19 Vaccine  Completed   Hepatitis C Screening  Completed   HIV Screening  Completed   HPV Vaccine  Aged Out     Follow up in 3 months  If you have any questions or concerns, please do not hesitate to call the office at [phone].  I look forward to our next visit and until then take care and stay safe.  Regards,   Dana Allan, MD   [facility]

## 2023-05-22 NOTE — Progress Notes (Signed)
SUBJECTIVE:   Chief Complaint  Patient presents with   mood disorder    6 month follow up    HPI Presents for follow up chronic disease management  Discussed the use of AI scribe software for clinical note transcription with the patient, who gave verbal consent to proceed.  History of Present Illness The patient, currently on a weight loss regimen, reports a successful response to the fourth dose of their medication. They note a significant decrease in appetite, feeling fuller faster, and reduced late-night snacking. Despite still experiencing hunger, they have made dietary changes, including increased water intake and protein consumption, while reducing carbohydrate intake. The patient has also noticed a slight weight loss. However, their weight remained stable during a recent vacation where they indulged in various foods.  The patient has not experienced any adverse effects from the medication, such as sickness or overeating. They report a change in eating habits, no longer feeling compelled to finish all food on their plate once they feel full.  The patient also mentions being on Zoloft 100mg , with fewer bad days noted since starting the medication. They report no need for refills on their current medications, including Pravachol and Synthroid.  The patient expresses concern about the availability and cost of their weight loss medication, as their insurance is reportedly dropping coverage for it at the start of the new year. They are exploring different options, including obtaining the medication directly from the manufacturer in vial form, which is reportedly cheaper.  The patient is planning a cruise trip to the [place] in the coming weeks.    PERTINENT PMH / PSH: As above  OBJECTIVE:  BP 123/78   Pulse 67   Temp 98 F (36.7 C)   Resp 18   Ht 5\' 2"  (1.575 m)   Wt 182 lb 2 oz (82.6 kg)   LMP 04/30/2023   SpO2 96%   BMI 33.31 kg/m    Physical Exam Vitals reviewed.   Constitutional:      General: She is not in acute distress.    Appearance: Normal appearance. She is obese. She is not ill-appearing, toxic-appearing or diaphoretic.  Eyes:     General:        Right eye: No discharge.        Left eye: No discharge.     Conjunctiva/sclera: Conjunctivae normal.  Cardiovascular:     Rate and Rhythm: Normal rate and regular rhythm.     Heart sounds: Normal heart sounds.  Pulmonary:     Effort: Pulmonary effort is normal.     Breath sounds: Normal breath sounds.  Abdominal:     General: Bowel sounds are normal.  Musculoskeletal:        General: Normal range of motion.  Skin:    General: Skin is warm and dry.  Neurological:     General: No focal deficit present.     Mental Status: She is alert and oriented to person, place, and time. Mental status is at baseline.  Psychiatric:        Mood and Affect: Mood normal.        Behavior: Behavior normal.        Thought Content: Thought content normal.        Judgment: Judgment normal.        05/22/2023    8:03 AM 04/18/2023    9:49 AM 11/19/2022    8:12 AM 10/29/2022    1:41 PM 04/30/2022    1:13 PM  Depression  screen PHQ 2/9  Decreased Interest 0 0 0 1 1  Down, Depressed, Hopeless 1 1 1 1 1   PHQ - 2 Score 1 1 1 2 2   Altered sleeping 0 1 1 1    Tired, decreased energy 0 1 0 1   Change in appetite 0 2 0 0   Feeling bad or failure about yourself  0 0 0 0   Trouble concentrating 0 0 1 0   Moving slowly or fidgety/restless 0 0 0 0   Suicidal thoughts 0 0 0 0   PHQ-9 Score 1 5 3 4    Difficult doing work/chores Somewhat difficult Not difficult at all Not difficult at all Somewhat difficult       05/22/2023    8:03 AM 04/18/2023    9:50 AM 11/19/2022    8:13 AM 10/29/2022    1:42 PM  GAD 7 : Generalized Anxiety Score  Nervous, Anxious, on Edge 0 0 0 0  Control/stop worrying 0 0 1 1  Worry too much - different things 0 0 0 0  Trouble relaxing 1 1 1  0  Restless 0 1 1 0  Easily annoyed or  irritable 1 0 0 1  Afraid - awful might happen 0 0 0 0  Total GAD 7 Score 2 2 3 2   Anxiety Difficulty Not difficult at all Not difficult at all Not difficult at all Somewhat difficult    ASSESSMENT/PLAN:  Obesity (BMI 30.0-34.9) Assessment & Plan: Patient reports positive response to [facility] 0.25mg , with decreased appetite and weight loss. No adverse effects reported. Patient has started on Wegovy 0.5mg . -Continue Wegovy 0.5mg  weekly. -Check weight and response to medication in 2-3 weeks.   Mood disorder (HCC) Assessment & Plan: Stable on Zoloft 100mg  daily. -Continue Zoloft 100mg  daily.   Hyperlipidemia, unspecified hyperlipidemia type Assessment & Plan: No issues reported. -Continue Pravachol as prescribed.   Hypothyroidism, unspecified type Assessment & Plan: No issues reported. Recent TSH normal -Continue Synthroid as prescribed.     PDMP reviewed  Return in about 3 months (around 08/20/2023) for PCP, weight management.  Dana Allan, MD

## 2023-05-26 ENCOUNTER — Other Ambulatory Visit: Payer: Self-pay | Admitting: Family Medicine

## 2023-05-26 DIAGNOSIS — Z1231 Encounter for screening mammogram for malignant neoplasm of breast: Secondary | ICD-10-CM

## 2023-05-28 ENCOUNTER — Encounter: Payer: Self-pay | Admitting: Family Medicine

## 2023-05-28 NOTE — Assessment & Plan Note (Signed)
No issues reported. -Continue Pravachol as prescribed.

## 2023-05-28 NOTE — Assessment & Plan Note (Signed)
Stable on Zoloft 100mg  daily. -Continue Zoloft 100mg  daily.

## 2023-05-28 NOTE — Assessment & Plan Note (Signed)
Patient reports positive response to [facility] 0.25mg , with decreased appetite and weight loss. No adverse effects reported. Patient has started on Wegovy 0.5mg . -Continue Wegovy 0.5mg  weekly. -Check weight and response to medication in 2-3 weeks.

## 2023-05-28 NOTE — Assessment & Plan Note (Signed)
No issues reported. Recent TSH normal -Continue Synthroid as prescribed.

## 2023-06-09 DIAGNOSIS — R5383 Other fatigue: Secondary | ICD-10-CM | POA: Diagnosis not present

## 2023-06-09 DIAGNOSIS — J069 Acute upper respiratory infection, unspecified: Secondary | ICD-10-CM | POA: Diagnosis not present

## 2023-06-27 ENCOUNTER — Ambulatory Visit
Admission: RE | Admit: 2023-06-27 | Discharge: 2023-06-27 | Disposition: A | Discharge status: Home / Self Care | Payer: Federal, State, Local not specified - PPO | Source: Ambulatory Visit | Attending: Family Medicine

## 2023-06-27 DIAGNOSIS — Z1231 Encounter for screening mammogram for malignant neoplasm of breast: Secondary | ICD-10-CM | POA: Diagnosis not present

## 2023-06-28 ENCOUNTER — Other Ambulatory Visit: Payer: Self-pay | Admitting: Family Medicine

## 2023-06-28 DIAGNOSIS — I10 Essential (primary) hypertension: Secondary | ICD-10-CM

## 2023-08-22 ENCOUNTER — Encounter: Payer: Self-pay | Admitting: Family Medicine

## 2023-08-22 ENCOUNTER — Telehealth: Payer: Self-pay | Admitting: Family Medicine

## 2023-08-22 ENCOUNTER — Ambulatory Visit: Payer: Federal, State, Local not specified - PPO | Admitting: Family Medicine

## 2023-08-22 VITALS — BP 112/66 | HR 75 | Temp 98.3°F | Resp 20 | Ht 62.0 in | Wt 178.4 lb

## 2023-08-22 DIAGNOSIS — E538 Deficiency of other specified B group vitamins: Secondary | ICD-10-CM | POA: Diagnosis not present

## 2023-08-22 DIAGNOSIS — E039 Hypothyroidism, unspecified: Secondary | ICD-10-CM | POA: Diagnosis not present

## 2023-08-22 DIAGNOSIS — E785 Hyperlipidemia, unspecified: Secondary | ICD-10-CM

## 2023-08-22 DIAGNOSIS — I10 Essential (primary) hypertension: Secondary | ICD-10-CM

## 2023-08-22 DIAGNOSIS — E66811 Obesity, class 1: Secondary | ICD-10-CM

## 2023-08-22 DIAGNOSIS — E559 Vitamin D deficiency, unspecified: Secondary | ICD-10-CM

## 2023-08-22 DIAGNOSIS — F39 Unspecified mood [affective] disorder: Secondary | ICD-10-CM

## 2023-08-22 LAB — CBC WITH DIFFERENTIAL/PLATELET
Basophils Absolute: 0.1 10*3/uL (ref 0.0–0.1)
Basophils Relative: 1.2 % (ref 0.0–3.0)
Eosinophils Absolute: 0.1 10*3/uL (ref 0.0–0.7)
Eosinophils Relative: 1.7 % (ref 0.0–5.0)
HCT: 44 % (ref 36.0–46.0)
Hemoglobin: 14.7 g/dL (ref 12.0–15.0)
Lymphocytes Relative: 35.6 % (ref 12.0–46.0)
Lymphs Abs: 2.1 10*3/uL (ref 0.7–4.0)
MCHC: 33.4 g/dL (ref 30.0–36.0)
MCV: 90.3 fl (ref 78.0–100.0)
Monocytes Absolute: 0.4 10*3/uL (ref 0.1–1.0)
Monocytes Relative: 5.9 % (ref 3.0–12.0)
Neutro Abs: 3.3 10*3/uL (ref 1.4–7.7)
Neutrophils Relative %: 55.6 % (ref 43.0–77.0)
Platelets: 284 10*3/uL (ref 150.0–400.0)
RBC: 4.87 Mil/uL (ref 3.87–5.11)
RDW: 13.7 % (ref 11.5–15.5)
WBC: 5.9 10*3/uL (ref 4.0–10.5)

## 2023-08-22 LAB — TSH: TSH: 4.03 u[IU]/mL (ref 0.35–5.50)

## 2023-08-22 LAB — COMPREHENSIVE METABOLIC PANEL
ALT: 14 U/L (ref 0–35)
AST: 18 U/L (ref 0–37)
Albumin: 4.6 g/dL (ref 3.5–5.2)
Alkaline Phosphatase: 61 U/L (ref 39–117)
BUN: 21 mg/dL (ref 6–23)
CO2: 31 meq/L (ref 19–32)
Calcium: 9.7 mg/dL (ref 8.4–10.5)
Chloride: 101 meq/L (ref 96–112)
Creatinine, Ser: 0.78 mg/dL (ref 0.40–1.20)
GFR: 91.86 mL/min (ref >60.00)
Glucose, Bld: 86 mg/dL (ref 70–99)
Potassium: 4.4 meq/L (ref 3.5–5.1)
Sodium: 138 meq/L (ref 135–145)
Total Bilirubin: 0.5 mg/dL (ref 0.2–1.2)
Total Protein: 7.3 g/dL (ref 6.0–8.3)

## 2023-08-22 LAB — LIPID PANEL
Cholesterol: 192 mg/dL (ref 0–200)
HDL: 53.1 mg/dL (ref >39.00)
LDL Cholesterol: 114 mg/dL — ABNORMAL HIGH (ref 0–99)
NonHDL: 138.86
Total CHOL/HDL Ratio: 4
Triglycerides: 123 mg/dL (ref 0.0–149.0)
VLDL: 24.6 mg/dL (ref 0.0–40.0)

## 2023-08-22 LAB — VITAMIN D 25 HYDROXY (VIT D DEFICIENCY, FRACTURES): VITD: 31.74 ng/mL (ref 30.00–100.00)

## 2023-08-22 LAB — VITAMIN B12: Vitamin B-12: 298 pg/mL (ref 211–911)

## 2023-08-22 MED ORDER — SERTRALINE HCL 50 MG PO TABS
75.0000 mg | ORAL_TABLET | Freq: Every day | ORAL | 3 refills | Status: AC
Start: 2023-08-22 — End: ?

## 2023-08-22 MED ORDER — PRAVASTATIN SODIUM 20 MG PO TABS: 20.0000 mg | ORAL_TABLET | Freq: Every day | ORAL | 3 refills | Status: DC

## 2023-08-22 MED ORDER — AMLODIPINE BESYLATE 2.5 MG PO TABS: 2.5000 mg | ORAL_TABLET | Freq: Every day | ORAL | Status: DC

## 2023-08-22 MED ORDER — LEVOTHYROXINE SODIUM 88 MCG PO TABS: 88.0000 ug | ORAL_TABLET | Freq: Every day | ORAL | 3 refills | Status: AC

## 2023-08-22 MED ORDER — HYDROCHLOROTHIAZIDE 25 MG PO TABS
ORAL_TABLET | ORAL | 3 refills | Status: AC
Start: 2023-08-22 — End: ?

## 2023-08-22 NOTE — Patient Instructions (Addendum)
 It was a pleasure meeting you today. Thank you for allowing me to take part in your health care.  Our goals for today as we discussed include:  Decrease Amlodipine to 2.5 mg daily Monitor blood pressure. If increases > 150/90 restart 5 mg amlodipine  We will get some labs today.  If they are abnormal or we need to do something about them, I will call you.  If they are normal, I will send you a message on MyChart (if it is active) or a letter in the mail.  If you don't hear from [place] in 2 weeks, please call the office at the number below.   Please bring in information for Zepbound  Follow up in 2 months  This is a list of the screening recommended for you and due dates:  Health Maintenance  Topic Date Due   Pap with HPV screening  09/02/2027   Colon Cancer Screening  09/06/2028   DTaP/Tdap/Td vaccine (2 - Td or Tdap) 10/20/2028   Flu Shot  Completed   COVID-19 Vaccine  Completed   Hepatitis C Screening  Completed   HIV Screening  Completed   HPV Vaccine  Aged Out       This is a list of the screening recommended for you and due dates:  Health Maintenance  Topic Date Due   Pap with HPV screening  09/02/2027   Colon Cancer Screening  09/06/2028   DTaP/Tdap/Td vaccine (2 - Td or Tdap) 10/20/2028   Flu Shot  Completed   COVID-19 Vaccine  Completed   Hepatitis C Screening  Completed   HIV Screening  Completed   HPV Vaccine  Aged Out      If you have any questions or concerns, please do not hesitate to call the office at [phone].  I look forward to our next visit and until then take care and stay safe.  Regards,   Dana Allan, MD   [facility]

## 2023-08-22 NOTE — Telephone Encounter (Signed)
 Pt dropped off Zepbound  paperwork for Dr Clent Ridges. Paperwork is up front in Dr Claris Che color folder.  [facility]

## 2023-08-22 NOTE — Progress Notes (Signed)
 SUBJECTIVE:   Chief Complaint  Patient presents with   Weight Check   HPI Presents for follow up weight   Discussed the use of AI scribe software for clinical note transcription with the patient, who gave verbal consent to proceed.  History of Present Illness Kaitlin Strickland is a 48 year old female with hypertension who presents for follow-up on weight loss management.  She was previously on [facility] for two months at the beginning of the year, which she tolerated well without side effects and found it helped curb her appetite. However, she had to stop the medication after her insurance, [employer], dropped coverage. Since then, she has been managing her weight by watching her diet, reducing snacking, and increasing her running, which has helped maintain her weight without significant fluctuation. She is currently considering trying Zepbound, as she has heard about potential discounts through [employer], although it is not covered by her insurance.  Regarding her hypertension, she is currently on hydrochlorothiazide 12.5 mg and amlodipine 5 mg. She reports occasional dizziness, particularly with quick movements, which she has noticed in the past few weeks. She does not regularly check her blood pressure at home. Her blood pressure was initially elevated during the COVID-19 pandemic, and she was on birth control pills at that time. She is considering reducing her amlodipine dose to 2.5 mg to see if it alleviates the dizziness.  She is also on Pravachol and thyroid medication, for which she will need refills. She has not had labs done recently but is prepared to have them done now.  She exercises regularly, running three times a week and doing weight training twice a week.  No constipation, diarrhea, chest pain, shortness of breath, palpitations, or issues with her Zoloft medication.     PERTINENT PMH / PSH: As above  OBJECTIVE:  BP 112/66   Pulse 75   Temp 98.3 F (36.8 C)    Resp 20   Ht 5\' 2"  (1.575 m)   Wt 178 lb 6 oz (80.9 kg)   LMP 08/08/2023   SpO2 98%   BMI 32.63 kg/m    Physical Exam Vitals reviewed.  Constitutional:      General: She is not in acute distress.    Appearance: Normal appearance. She is obese. She is not ill-appearing, toxic-appearing or diaphoretic.  Eyes:     General:        Right eye: No discharge.        Left eye: No discharge.     Conjunctiva/sclera: Conjunctivae normal.  Neck:     Thyroid: No thyromegaly or thyroid tenderness.  Cardiovascular:     Rate and Rhythm: Normal rate and regular rhythm.     Heart sounds: Normal heart sounds.  Pulmonary:     Effort: Pulmonary effort is normal.     Breath sounds: Normal breath sounds.  Abdominal:     General: Bowel sounds are normal.  Musculoskeletal:        General: Normal range of motion.  Skin:    General: Skin is warm and dry.  Neurological:     General: No focal deficit present.     Mental Status: She is alert and oriented to person, place, and time. Mental status is at baseline.  Psychiatric:        Mood and Affect: Mood normal.        Behavior: Behavior normal.        Thought Content: Thought content normal.  Judgment: Judgment normal.           08/22/2023    7:46 AM 05/22/2023    8:03 AM 04/18/2023    9:49 AM 11/19/2022    8:12 AM 10/29/2022    1:41 PM  Depression screen PHQ 2/9  Decreased Interest 1 0 0 0 1  Down, Depressed, Hopeless 0 1 1 1 1   PHQ - 2 Score 1 1 1 1 2   Altered sleeping 1 0 1 1 1   Tired, decreased energy 1 0 1 0 1  Change in appetite 1 0 2 0 0  Feeling bad or failure about yourself  0 0 0 0 0  Trouble concentrating 0 0 0 1 0  Moving slowly or fidgety/restless 0 0 0 0 0  Suicidal thoughts 0 0 0 0 0  PHQ-9 Score 4 1 5 3 4   Difficult doing work/chores Not difficult at all Somewhat difficult Not difficult at all Not difficult at all Somewhat difficult      08/22/2023    7:46 AM 05/22/2023    8:03 AM 04/18/2023    9:50 AM  11/19/2022    8:13 AM  GAD 7 : Generalized Anxiety Score  Nervous, Anxious, on Edge 0 0 0 0  Control/stop worrying 0 0 0 1  Worry too much - different things 0 0 0 0  Trouble relaxing 1 1 1 1   Restless 0 0 1 1  Easily annoyed or irritable 1 1 0 0  Afraid - awful might happen 0 0 0 0  Total GAD 7 Score 2 2 2 3   Anxiety Difficulty  Not difficult at all Not difficult at all Not difficult at all    ASSESSMENT/PLAN:  Obesity (BMI 30.0-34.9) Assessment & Plan: Previously managed with ZOXWRU, now considering Zepbound for weight management. Discussed cost-effective options and availability. - Discuss Zepbound options, including cost and availability through [employer]. - Consider prescribing Zepbound if she decides to proceed and provide instructions for ordering through [employer]. - Schedule follow-up in 4-6 weeks after starting Zepbound to monitor progress.   Hypothyroidism, unspecified type Assessment & Plan: On thyroid medication, requires refills. No recent thyroid function tests. - Order lab tests to assess thyroid function. - Refill Levothyroxine 88 mcg   Orders: -     TSH  Essential hypertension Assessment & Plan: Blood pressure controlled on current regimen. Occasional dizziness suggests possible hypotension due to weight loss and lifestyle changes. - Reduce amlodipine to 2.5 mg and monitor blood pressure at home. - Refill hydrochlorothiazide 12.5 mg daily - Check Cmet - Instruct her to check blood pressure regularly and report significant changes. - Consider further medication adjustments if blood pressure remains stable.  Orders: -     Comprehensive metabolic panel with GFR -     hydroCHLOROthiazide; TAKE 0.5 TABLETS (12.5 MG TOTAL) BY MOUTH DAILY. IN THE MORNING  Dispense: 45 tablet; Refill: 3 -     amLODIPine Besylate; Take 1 tablet (2.5 mg total) by mouth daily. -     CBC with Differential/Platelet  Hyperlipidemia, unspecified hyperlipidemia type Assessment  & Plan: On Pravachol, requires refills. No recent lipid panel - Refill Pravachol. - Check lipids  Orders: -     Lipid panel -     Pravastatin Sodium; Take 1 tablet (20 mg total) by mouth daily. After 6 pm  Dispense: 90 tablet; Refill: 3  Vitamin D deficiency -     VITAMIN D 25 Hydroxy (Vit-D Deficiency, Fractures)  Mood disorder (HCC) Assessment &  Plan: Stable  -Refill Zoloft 100mg  daily.  Orders: -     Sertraline HCl; Take 1.5 tablets (75 mg total) by mouth daily.  Dispense: 135 tablet; Refill: 3  Acquired hypothyroidism Assessment & Plan: On thyroid medication, requires refills. No recent thyroid function tests. - Order lab tests to assess thyroid function. - Refill Levothyroxine 88 mcg   Orders: -     Levothyroxine Sodium; Take 1 tablet (88 mcg total) by mouth daily before breakfast. 30 minutes before breakfast  Dispense: 90 tablet; Refill: 3  Vitamin B 12 deficiency -     Vitamin B12    PDMP reviewed  Return in 2 months (on 10/22/2023), or if symptoms worsen or fail to improve, for PCP.  Dana Allan, MD

## 2023-08-31 ENCOUNTER — Encounter: Payer: Self-pay | Admitting: Family Medicine

## 2023-08-31 DIAGNOSIS — E538 Deficiency of other specified B group vitamins: Secondary | ICD-10-CM | POA: Insufficient documentation

## 2023-08-31 NOTE — Assessment & Plan Note (Addendum)
 Blood pressure controlled on current regimen. Occasional dizziness suggests possible hypotension due to weight loss and lifestyle changes. - Reduce amlodipine to 2.5 mg and monitor blood pressure at home. - Refill hydrochlorothiazide 12.5 mg daily - Check Cmet - Instruct her to check blood pressure regularly and report significant changes. - Consider further medication adjustments if blood pressure remains stable.

## 2023-08-31 NOTE — Assessment & Plan Note (Signed)
 Previously managed with [facility]GNFA[facility], now considering Zepbound for weight management. Discussed cost-effective options and availability. - Discuss Zepbound options, including cost and availability through [employer]. - Consider prescribing Zepbound if she decides to proceed and provide instructions for ordering through [employer]. - Schedule follow-up in 4-6 weeks after starting Zepbound to monitor progress.

## 2023-08-31 NOTE — Assessment & Plan Note (Addendum)
 On Pravachol, requires refills. No recent lipid panel - Refill Pravachol. - Check lipids

## 2023-08-31 NOTE — Assessment & Plan Note (Signed)
 On thyroid medication, requires refills. No recent thyroid function tests. - Order lab tests to assess thyroid function. - Refill Levothyroxine 88 mcg

## 2023-08-31 NOTE — Assessment & Plan Note (Signed)
 Stable  -Refill Zoloft 100mg  daily.

## 2023-09-01 ENCOUNTER — Other Ambulatory Visit: Payer: Self-pay | Admitting: Family Medicine

## 2023-09-01 ENCOUNTER — Other Ambulatory Visit (HOSPITAL_COMMUNITY): Payer: Self-pay

## 2023-09-01 ENCOUNTER — Telehealth: Payer: Self-pay | Admitting: Pharmacist

## 2023-09-01 ENCOUNTER — Telehealth: Payer: Self-pay

## 2023-09-01 DIAGNOSIS — E66811 Obesity, class 1: Secondary | ICD-10-CM

## 2023-09-01 DIAGNOSIS — E785 Hyperlipidemia, unspecified: Secondary | ICD-10-CM

## 2023-09-01 DIAGNOSIS — I10 Essential (primary) hypertension: Secondary | ICD-10-CM

## 2023-09-01 MED ORDER — PRAVASTATIN SODIUM 40 MG PO TABS: 20.0000 mg | ORAL_TABLET | Freq: Every day | ORAL | 3 refills | Status: DC

## 2023-09-01 MED ORDER — ZEPBOUND 2.5 MG/0.5ML ~~LOC~~ SOAJ: 2.5000 mg | SUBCUTANEOUS | 0 refills | Status: DC

## 2023-09-01 NOTE — Telephone Encounter (Signed)
 See Result Management encounter in patient's chart.

## 2023-09-01 NOTE — Telephone Encounter (Signed)
 Pharmacy Patient Advocate Encounter   Received notification from Patient Pharmacy that prior authorization for Zepbound 2.5MG /0.5ML pen-injectors is required/requested.   Insurance verification completed.   The patient is insured through [employer] .   Per test claim: PA required; PA submitted to above mentioned insurance via CoverMyMeds Key/confirmation #/EOC [facility] Status is pending

## 2023-09-01 NOTE — Telephone Encounter (Signed)
 Left detailed message for patient to make her aware of Dr Claris Che recommendations. Advised patient to give our office a call back to discuss if she has any questions or concerns.  OK to relay note if patient calls back. If relayed, please notify the office.

## 2023-09-01 NOTE — Telephone Encounter (Signed)
 Copied from CRM [id number]. Topic: Clinical - Lab/Test Results >> Sep 01, 2023 12:18 PM Sim Boast F wrote: Reason for CRM: Patient returned call regarding lab results, I relayed the message to patient and she agreed with increasing the Pravastatin to 40 mg daily and says this could be sent to the [facility] - [place], [place] - [address]

## 2023-09-02 ENCOUNTER — Other Ambulatory Visit: Payer: Self-pay

## 2023-09-02 DIAGNOSIS — E785 Hyperlipidemia, unspecified: Secondary | ICD-10-CM

## 2023-09-02 DIAGNOSIS — E66811 Obesity, class 1: Secondary | ICD-10-CM

## 2023-09-02 DIAGNOSIS — I10 Essential (primary) hypertension: Secondary | ICD-10-CM

## 2023-09-02 MED ORDER — ZEPBOUND 2.5 MG/0.5ML ~~LOC~~ SOAJ: 2.5000 mg | SUBCUTANEOUS | 0 refills | Status: DC

## 2023-09-02 NOTE — Telephone Encounter (Signed)
 Pharmacy Patient Advocate Encounter  Received notification from [facility] that Prior Authorization for Zepbound 2.5MG /0.5ML pen-injectors has been DENIED.  See denial reason below. No denial letter attached in CMM. Will attach denial letter to Media tab once received.

## 2023-09-02 NOTE — Telephone Encounter (Signed)
 Sent RX request to pharmacy that pt request it to be sent to. Sent pt a mychart message to inform her of this information

## 2023-09-15 ENCOUNTER — Emergency Department
Admission: EM | Admit: 2023-09-15 | Discharge: 2023-09-15 | Disposition: A | Discharge status: Home / Self Care | Attending: Emergency Medicine | Admitting: Emergency Medicine

## 2023-09-15 ENCOUNTER — Emergency Department

## 2023-09-15 ENCOUNTER — Other Ambulatory Visit: Payer: Self-pay

## 2023-09-15 DIAGNOSIS — R001 Bradycardia, unspecified: Secondary | ICD-10-CM | POA: Diagnosis not present

## 2023-09-15 DIAGNOSIS — R1011 Right upper quadrant pain: Secondary | ICD-10-CM | POA: Diagnosis not present

## 2023-09-15 DIAGNOSIS — I10 Essential (primary) hypertension: Secondary | ICD-10-CM | POA: Diagnosis not present

## 2023-09-15 LAB — COMPREHENSIVE METABOLIC PANEL WITH GFR
ALT: 28 U/L (ref 0–44)
AST: 30 U/L (ref 15–41)
Albumin: 4.1 g/dL (ref 3.5–5.0)
Alkaline Phosphatase: 63 U/L (ref 38–126)
Anion gap: 8 (ref 5–15)
BUN: 17 mg/dL (ref 6–20)
CO2: 26 mmol/L (ref 22–32)
Calcium: 9 mg/dL (ref 8.9–10.3)
Chloride: 105 mmol/L (ref 98–111)
Creatinine, Ser: 0.83 mg/dL (ref 0.44–1.00)
GFR, Estimated: >60 mL/min (ref >60)
Glucose, Bld: 86 mg/dL (ref 70–99)
Potassium: 4.1 mmol/L (ref 3.5–5.1)
Sodium: 139 mmol/L (ref 135–145)
Total Bilirubin: 0.4 mg/dL (ref 0.0–1.2)
Total Protein: 6.9 g/dL (ref 6.5–8.1)

## 2023-09-15 LAB — URINALYSIS, ROUTINE W REFLEX MICROSCOPIC
Bacteria, UA: NONE SEEN
Bilirubin Urine: NEGATIVE
Glucose, UA: NEGATIVE mg/dL
Hgb urine dipstick: NEGATIVE
Ketones, ur: NEGATIVE mg/dL
Nitrite: NEGATIVE
Protein, ur: NEGATIVE mg/dL
Specific Gravity, Urine: 1.024 (ref 1.005–1.030)
pH: 5 (ref 5.0–8.0)

## 2023-09-15 LAB — CBC
HCT: 42.3 % (ref 36.0–46.0)
Hemoglobin: 14.2 g/dL (ref 12.0–15.0)
MCH: 30.8 pg (ref 26.0–34.0)
MCHC: 33.6 g/dL (ref 30.0–36.0)
MCV: 91.8 fL (ref 80.0–100.0)
Platelets: 260 10*3/uL (ref 150–400)
RBC: 4.61 MIL/uL (ref 3.87–5.11)
RDW: 13.1 % (ref 11.5–15.5)
WBC: 5.9 10*3/uL (ref 4.0–10.5)
nRBC: 0 % (ref 0.0–0.2)

## 2023-09-15 LAB — PREGNANCY, URINE: Preg Test, Ur: NEGATIVE

## 2023-09-15 LAB — LIPASE, BLOOD: Lipase: 49 U/L (ref 11–51)

## 2023-09-15 MED ORDER — OMEPRAZOLE MAGNESIUM 20 MG PO TBEC: 20.0000 mg | DELAYED_RELEASE_TABLET | Freq: Every day | ORAL | 1 refills | Status: DC

## 2023-09-15 NOTE — ED Provider Notes (Signed)
 [facility] Provider Note    Event Date/Time   First MD Initiated Contact with Patient 09/15/23 [phone]     (approximate)   History   Abdominal Pain   HPI  Kaitlin Strickland is a 48 y.o. female past medical history significant for hypertension, hyperlipidemia who presented to the emergency department with right upper abdominal pain.  Endorses intermittent episodes of right sided abdominal pain over the past couple of months that worsened and continued today.  Complaining of a dull pain to her right upper abdomen.  States that it be intermittently sharp in nature.  Denies any pain at this time.  Not associated with nausea, vomiting, fever or chills.  No weight loss or weight gain.  No chest pain or shortness of breath.  No cough.  No difficulty breathing.  Denies any diarrhea.  No dysuria, urinary urgency or frequency.  Denies any concern for pregnancy.  No prior abdominal surgery.  No falls or trauma.  No new medications.  Last bowel movement was this morning and was normal.     Physical Exam   Triage Vital Signs: ED Triage Vitals [09/15/23 0704]  Encounter Vitals Group     BP 136/70     Systolic BP Percentile      Diastolic BP Percentile      Pulse Rate 73     Resp 17     Temp 97.8 F (36.6 C)     Temp Source Oral     SpO2 97 %     Weight 175 lb (79.4 kg)     Height 5\' 2"  (1.575 m)     Head Circumference      Peak Flow      Pain Score 7     Pain Loc      Pain Education      Exclude from Growth Chart     Most recent vital signs: Vitals:   09/15/23 0704  BP: 136/70  Pulse: 73  Resp: 17  Temp: 97.8 F (36.6 C)  SpO2: 97%    Physical Exam Constitutional:      Appearance: She is well-developed.  HENT:     Head: Atraumatic.  Eyes:     Conjunctiva/sclera: Conjunctivae normal.  Cardiovascular:     Rate and Rhythm: Regular rhythm.  Pulmonary:     Effort: Pulmonary effort is normal. No respiratory distress.  Abdominal:     General: There is no  distension.     Tenderness: There is abdominal tenderness in the right upper quadrant. There is no right CVA tenderness, left CVA tenderness, guarding or rebound. Negative signs include Murphy's sign, Rovsing's sign and McBurney's sign.  Musculoskeletal:        General: Normal range of motion.     Cervical back: Normal range of motion.  Skin:    General: Skin is warm.     Capillary Refill: Capillary refill takes less than 2 seconds.  Neurological:     Mental Status: She is alert. Mental status is at baseline.     IMPRESSION / MDM / ASSESSMENT AND PLAN / ED COURSE  I reviewed the triage vital signs and the nursing notes.  Differential diagnosis including symptomatic cholelithiasis, acute cholecystitis, pancreatitis, gastritis/PUD, ACS.  Low suspicion for acute appendicitis no abdominal tenderness palpation.  Have low suspicion for pyelonephritis or kidney stones.  Pregnancy test negative  EKG  I, Corena Herter, the attending physician, personally viewed and interpreted this ECG.   Rate: Normal  Rhythm: Normal  sinus  Axis: Normal  Intervals: Normal  ST&T Change: None  No tachycardic or bradycardic dysrhythmias while on cardiac telemetry.  RADIOLOGY I independently reviewed imaging, my interpretation of imaging: Gallbladder ultrasound read as no acute findings.  No findings of gallstones.  Normal gallbladder wall  LABS (all labs ordered are listed, but only abnormal results are displayed) Labs interpreted as -    Labs Reviewed  URINALYSIS, ROUTINE W REFLEX MICROSCOPIC - Abnormal; Notable for the following components:      Result Value   Color, Urine YELLOW (*)    APPearance HAZY (*)    Leukocytes,Ua MODERATE (*)    All other components within normal limits  LIPASE, BLOOD  COMPREHENSIVE METABOLIC PANEL WITH GFR  CBC  PREGNANCY, URINE     MDM    Lab work overall unremarkable with no leukocytosis.  Normal lipase and LFTs.  Normal total bili.  Pregnancy test and  urine without abnormality.  Reevaluation continues to have benign abdominal exam that is nontender to palpation.  No nausea or vomiting in currently feeling okay.  Discussed close follow-up with primary care physician for any ongoing symptoms.  Discussed possible HIDA scan if she had ongoing symptoms.  Discussed gallbladder eating plan.  Discussed return to the emergency department for any ongoing or worsening symptoms.  No questions at time of discharge.  Start the patient on a PPI.   PROCEDURES:  Critical Care performed: No  Procedures  Patient's presentation is most consistent with acute complicated illness / injury requiring diagnostic workup.   MEDICATIONS ORDERED IN ED: Medications - No data to display  FINAL CLINICAL IMPRESSION(S) / ED DIAGNOSES   Final diagnoses:  RUQ abdominal pain     Rx / DC Orders   ED Discharge Orders          Ordered    omeprazole (PRILOSEC OTC) 20 MG tablet  Daily,   Status:  Discontinued        09/15/23 0832    omeprazole (PRILOSEC OTC) 20 MG tablet  Daily        09/15/23 2956             Note:  This document was prepared using Dragon voice recognition software and may include unintentional dictation errors.   Viviano Ground, MD 09/15/23 [phone]

## 2023-09-15 NOTE — ED Notes (Signed)
 See triage note  Presents with pain to right upper quad  States pain has been intermittent but this am the intensity is worse  Denies any n/v/d/ or fever  And states pain does not radiate

## 2023-09-15 NOTE — Discharge Instructions (Signed)
 You are seen in the emergency department for right upper abdominal pain.  She had an ultrasound of your gallbladder that did not show any signs of a gallbladder infection or gallstones.  Your lab work was overall normal.  You were started on an acid reducing medication.  Follow-up closely with your primary care physician.  Return to the emergency department for any ongoing or worsening symptoms.  Pain control:  Acetaminophen (tylenol) - You can take 2 extra strength tablets (1000 mg) every 6 hours as needed for pain/fever.

## 2023-09-15 NOTE — ED Triage Notes (Signed)
 Pt presents to ED with RUQ that has been for 2 weeks pt denies N/V/D. Pt denies fevers. Pt states pain is intermittent. NAD noted.

## 2023-09-18 ENCOUNTER — Encounter: Payer: Self-pay | Admitting: Family Medicine

## 2023-09-24 ENCOUNTER — Other Ambulatory Visit: Payer: Self-pay | Admitting: Family Medicine

## 2023-09-24 DIAGNOSIS — E785 Hyperlipidemia, unspecified: Secondary | ICD-10-CM

## 2023-09-24 MED ORDER — PRAVASTATIN SODIUM 40 MG PO TABS: 40.0000 mg | ORAL_TABLET | Freq: Every day | ORAL | 3 refills | Status: AC

## 2023-09-28 ENCOUNTER — Other Ambulatory Visit: Payer: Self-pay | Admitting: Family Medicine

## 2023-09-30 ENCOUNTER — Other Ambulatory Visit: Payer: Self-pay | Admitting: Family Medicine

## 2023-09-30 MED ORDER — TIRZEPATIDE-WEIGHT MANAGEMENT 2.5 MG/0.5ML ~~LOC~~ SOLN: 2.5000 mg | SUBCUTANEOUS | 1 refills | Status: DC

## 2023-10-10 ENCOUNTER — Encounter (HOSPITAL_COMMUNITY): Payer: Self-pay

## 2023-10-17 ENCOUNTER — Encounter: Payer: Self-pay | Admitting: Family Medicine

## 2023-10-17 ENCOUNTER — Ambulatory Visit: Admitting: Family Medicine

## 2023-10-17 VITALS — BP 110/68 | HR 73 | Temp 97.9°F | Resp 20 | Ht 62.0 in | Wt 176.0 lb

## 2023-10-17 DIAGNOSIS — E039 Hypothyroidism, unspecified: Secondary | ICD-10-CM | POA: Diagnosis not present

## 2023-10-17 DIAGNOSIS — I10 Essential (primary) hypertension: Secondary | ICD-10-CM

## 2023-10-17 DIAGNOSIS — Z87898 Personal history of other specified conditions: Secondary | ICD-10-CM | POA: Diagnosis not present

## 2023-10-17 DIAGNOSIS — E66811 Obesity, class 1: Secondary | ICD-10-CM | POA: Diagnosis not present

## 2023-10-17 DIAGNOSIS — E785 Hyperlipidemia, unspecified: Secondary | ICD-10-CM

## 2023-10-17 NOTE — Patient Instructions (Addendum)
 It was a pleasure meeting you today. Thank you for allowing me to take part in your health care.  Our goals for today as we discussed include:  Continue Zepbound  2.5 mg weekly Can increase to 5 mg weekly in 2-3 weeks if tolerating.  Notify MD and can send in prescription.  Increase fiber intake Continue to hydrate well  Hold Amlodipine  for few days.  Blood pressure is soft.  If remains <130/90 can discontinue.  If blood pressure increases to >140/90 can restart the Amlodipine .    This is a list of the screening recommended for you and due dates:  Health Maintenance  Topic Date Due   Flu Shot  01/02/2024   Pap with HPV screening  09/02/2027   Colon Cancer Screening  09/06/2028   DTaP/Tdap/Td vaccine (2 - Td or Tdap) 10/20/2028   COVID-19 Vaccine  Completed   Hepatitis C Screening  Completed   HIV Screening  Completed   HPV Vaccine  Aged Out   Meningitis B Vaccine  Aged Out     If you have any questions or concerns, please do not hesitate to call the office at [phone].  I look forward to our next visit and until then take care and stay safe.  Regards,   Valli Gaw, MD   [facility]

## 2023-10-17 NOTE — Progress Notes (Signed)
 SUBJECTIVE:   Chief Complaint  Patient presents with   Obesity    2 month follow up    HPI Presents for follow up chronic disease management  Discussed the use of AI scribe software for clinical note transcription with the patient, who gave verbal consent to proceed.  History of Present Illness Kaitlin Strickland is a 48 year old female with hypertension who presents for a follow-up on weight management and hypertension.  She has been on a weight management medication for two weeks, experiencing no significant side effects except for reduced appetite and overall food intake. She notes weight loss, which she attributes to dietary changes. She receives a monthly supply of the medication in single injectable vials and is currently on a 2.5 mg dose.  She has a history of hypertension and is currently taking a half tablet of amlodipine , which she finds inconvenient to split. Her blood pressure readings have improved, with a recent reading of 110/68 mmHg, compared to significantly higher readings around 170/110 mmHg during the COVID-19 pandemic. She has made lifestyle modifications, including consistent running, which she believes have contributed to her improved blood pressure and heart rate. She has enough amlodipine  tablets for the next four weeks and will monitor her blood pressure closely.  She reports intermittent abdominal discomfort, described as a 'dull, weird pain' that comes and goes. This pain predates her current medication and has not worsened since starting it. An ER visit included an ultrasound showing a normal gallbladder, and she was advised it might be reflux. However, she has not taken the prescribed omeprazole , as she does not believe it is reflux-related. The abdominal pain is irregular, does not disturb her sleep, and is not associated with changes in bowel habits, nausea, vomiting, or urinary issues. She drinks more water than usual and has not noticed any gas or significant changes  in bowel movements. The pain is sometimes felt in the upper abdomen and occasionally lower, but it is not consistent.     PERTINENT PMH / PSH: As above  OBJECTIVE:  BP 110/68   Pulse 73   Temp 97.9 F (36.6 C)   Resp 20   Ht 5\' 2"  (1.575 m)   Wt 176 lb (79.8 kg)   LMP 09/29/2023   SpO2 99%   BMI 32.19 kg/m    Physical Exam Vitals reviewed.  Constitutional:      General: She is not in acute distress.    Appearance: Normal appearance. She is obese. She is not ill-appearing, toxic-appearing or diaphoretic.  Eyes:     General:        Right eye: No discharge.        Left eye: No discharge.     Conjunctiva/sclera: Conjunctivae normal.  Cardiovascular:     Rate and Rhythm: Normal rate and regular rhythm.     Heart sounds: Normal heart sounds.  Pulmonary:     Effort: Pulmonary effort is normal.     Breath sounds: Normal breath sounds.  Abdominal:     General: Bowel sounds are normal.  Musculoskeletal:        General: Normal range of motion.  Skin:    General: Skin is warm and dry.  Neurological:     General: No focal deficit present.     Mental Status: She is alert and oriented to person, place, and time. Mental status is at baseline.  Psychiatric:        Mood and Affect: Mood normal.  Behavior: Behavior normal.        Thought Content: Thought content normal.        Judgment: Judgment normal.           10/17/2023    8:06 AM 08/22/2023    7:46 AM 05/22/2023    8:03 AM 04/18/2023    9:49 AM 11/19/2022    8:12 AM  Depression screen PHQ 2/9  Decreased Interest 1 1 0 0 0  Down, Depressed, Hopeless 1 0 1 1 1   PHQ - 2 Score 2 1 1 1 1   Altered sleeping 0 1 0 1 1  Tired, decreased energy 2 1 0 1 0  Change in appetite 0 1 0 2 0  Feeling bad or failure about yourself  0 0 0 0 0  Trouble concentrating 0 0 0 0 1  Moving slowly or fidgety/restless 0 0 0 0 0  Suicidal thoughts 0 0 0 0 0  PHQ-9 Score 4 4 1 5 3   Difficult doing work/chores Somewhat difficult Not  difficult at all Somewhat difficult Not difficult at all Not difficult at all      10/17/2023    8:06 AM 08/22/2023    7:46 AM 05/22/2023    8:03 AM 04/18/2023    9:50 AM  GAD 7 : Generalized Anxiety Score  Nervous, Anxious, on Edge 0 0 0 0  Control/stop worrying 0 0 0 0  Worry too much - different things 1 0 0 0  Trouble relaxing 1 1 1 1   Restless 1 0 0 1  Easily annoyed or irritable 1 1 1  0  Afraid - awful might happen 0 0 0 0  Total GAD 7 Score 4 2 2 2   Anxiety Difficulty Somewhat difficult  Not difficult at all Not difficult at all    ASSESSMENT/PLAN:  Obesity (BMI 30.0-34.9) Assessment & Plan: Tolerating Zepbound  2.5 mg weekly - Continue current dose Zepbound  weekly - Consider increasing in couple of weeks.  Patient is paying out of pocket so will wait for patient to contact for increase    Essential hypertension Assessment & Plan: Blood pressure well-controlled with current regimen. Considering trial off amlodipine  due to control and lifestyle changes. - Discontinue amlodipine  for a trial period. - Continue monitoring blood pressure regularly. - Continue hydrochlorothiazide  12.5 mg. - Restart amlodipine  if blood pressure increases significantly or diastolic pressure rises consistently.   History of abdominal pain Assessment & Plan: Intermittent, dull pain for weeks, not linked to meals or activities. Was recently evaluated in ED and RUQ u/s unremarkable.   Gallbladder issues ruled out. Possible gas-related discomfort. No significant bowel changes. - Consider further evaluation if symptoms worsen or persist.   Hypothyroidism, unspecified type Assessment & Plan: Stable - Continue Levothyroxine  88 mcg    Hyperlipidemia, unspecified hyperlipidemia type Assessment & Plan: - Continue Pravachol .     PDMP reviewed  Return if symptoms worsen or fail to improve, for PCP.  Valli Gaw, MD

## 2023-10-26 ENCOUNTER — Encounter: Payer: Self-pay | Admitting: Family Medicine

## 2023-10-26 DIAGNOSIS — Z87898 Personal history of other specified conditions: Secondary | ICD-10-CM | POA: Insufficient documentation

## 2023-10-26 NOTE — Assessment & Plan Note (Signed)
 Tolerating Zepbound  2.5 mg weekly - Continue current dose Zepbound  weekly - Consider increasing in couple of weeks.  Patient is paying out of pocket so will wait for patient to contact for increase

## 2023-10-26 NOTE — Assessment & Plan Note (Signed)
 -  Continue Pravachol ?

## 2023-10-26 NOTE — Assessment & Plan Note (Signed)
 Intermittent, dull pain for weeks, not linked to meals or activities. Was recently evaluated in ED and RUQ u/s unremarkable.   Gallbladder issues ruled out. Possible gas-related discomfort. No significant bowel changes. - Consider further evaluation if symptoms worsen or persist.

## 2023-10-26 NOTE — Assessment & Plan Note (Signed)
 Blood pressure well-controlled with current regimen. Considering trial off amlodipine  due to control and lifestyle changes. - Discontinue amlodipine  for a trial period. - Continue monitoring blood pressure regularly. - Continue hydrochlorothiazide  12.5 mg. - Restart amlodipine  if blood pressure increases significantly or diastolic pressure rises consistently.

## 2023-10-26 NOTE — Assessment & Plan Note (Signed)
 Stable. Continue Levothyroxine 88 mcg

## 2023-11-10 ENCOUNTER — Other Ambulatory Visit: Payer: Self-pay | Admitting: Family Medicine

## 2023-11-10 NOTE — Telephone Encounter (Signed)
 Left message to return call to our office to see if she requested the medication or the pharmacy?

## 2023-11-11 ENCOUNTER — Telehealth: Payer: Self-pay

## 2023-11-11 NOTE — Telephone Encounter (Signed)
 Copied from CRM [id number]. Topic: Clinical - Medication Question >> Nov 11, 2023  8:03 AM Kaitlin Strickland wrote: Reason for CRM: Patient was returning call, she said she is going to speak to [name] Friday about going up in dosage for the Trizepatide before putting in another refill request

## 2023-11-12 ENCOUNTER — Encounter: Payer: Self-pay | Admitting: Family Medicine

## 2023-11-12 ENCOUNTER — Encounter: Payer: Self-pay | Admitting: Nurse Practitioner

## 2023-11-12 ENCOUNTER — Telehealth: Payer: Self-pay

## 2023-11-12 NOTE — Telephone Encounter (Signed)
 I left a voicemail for patient asking her to please call us  to reschedule her 11/14/2023 appointment with [name], NP, as provider will be out of the office.  I also sent a letter to patient via MyChart.  E2C2 - when patient calls back, please assist her with rescheduling her appointment with [name], NP.

## 2023-11-13 ENCOUNTER — Other Ambulatory Visit: Payer: Self-pay | Admitting: Family Medicine

## 2023-11-13 DIAGNOSIS — E66811 Obesity, class 1: Secondary | ICD-10-CM

## 2023-11-13 MED ORDER — TIRZEPATIDE-WEIGHT MANAGEMENT 5 MG/0.5ML ~~LOC~~ SOLN: 5.0000 mg | SUBCUTANEOUS | 0 refills | Status: DC

## 2023-11-14 ENCOUNTER — Encounter: Admitting: Nurse Practitioner

## 2024-01-23 ENCOUNTER — Ambulatory Visit: Admitting: Family Medicine

## 2024-01-23 ENCOUNTER — Other Ambulatory Visit: Payer: Self-pay

## 2024-01-23 ENCOUNTER — Encounter: Payer: Self-pay | Admitting: Family Medicine

## 2024-01-23 VITALS — BP 134/85 | HR 67 | Temp 97.7°F | Resp 20 | Ht 62.0 in | Wt 165.0 lb

## 2024-01-23 DIAGNOSIS — R87610 Atypical squamous cells of undetermined significance on cytologic smear of cervix (ASC-US): Secondary | ICD-10-CM | POA: Diagnosis not present

## 2024-01-23 DIAGNOSIS — E782 Mixed hyperlipidemia: Secondary | ICD-10-CM | POA: Diagnosis not present

## 2024-01-23 DIAGNOSIS — E66811 Obesity, class 1: Secondary | ICD-10-CM

## 2024-01-23 DIAGNOSIS — E559 Vitamin D deficiency, unspecified: Secondary | ICD-10-CM

## 2024-01-23 DIAGNOSIS — I10 Essential (primary) hypertension: Secondary | ICD-10-CM

## 2024-01-23 DIAGNOSIS — R87810 Cervical high risk human papillomavirus (HPV) DNA test positive: Secondary | ICD-10-CM

## 2024-01-23 DIAGNOSIS — E039 Hypothyroidism, unspecified: Secondary | ICD-10-CM

## 2024-01-23 MED ORDER — TIRZEPATIDE-WEIGHT MANAGEMENT 5 MG/0.5ML ~~LOC~~ SOLN
5.0000 mg | SUBCUTANEOUS | 0 refills | Status: DC
Start: 2024-01-23 — End: 2024-01-23

## 2024-01-23 MED ORDER — TIRZEPATIDE-WEIGHT MANAGEMENT 7.5 MG/0.5ML ~~LOC~~ SOLN: 7.5000 mg | SUBCUTANEOUS | 0 refills | Status: DC

## 2024-01-23 NOTE — Assessment & Plan Note (Signed)
 Has been on levothyroxine  88 mcg.

## 2024-01-23 NOTE — Assessment & Plan Note (Signed)
 Currently on Pravachol  40 mg daily.  Her 10-year ASCVD risk is 1.2%.

## 2024-01-23 NOTE — Assessment & Plan Note (Signed)
 She is on Zepbound  and paying for it out-of-pocket.  She has been on 5 mg weekly for the last several months.  She is interested in increasing the dosage to 7.5 mg.  Ask her to follow-up in a month to check on her side effect profile and weight loss.

## 2024-01-23 NOTE — Progress Notes (Signed)
 Established Patient Office Visit  Subjective   Patient ID: Kaitlin Strickland, female    DOB: March 16, 1976  Age: 48 y.o. MRN: [id number]  Chief Complaint  Patient presents with   Establish Care   Weight Management Screening    HPI Discussed the use of AI scribe software for clinical note transcription with the patient, who gave verbal consent to proceed.  History of Present Illness   Kaitlin Strickland is a 48 year old female who presents for medication management and follow-up.  She is currently managing hypertension with hydrochlorothiazide  25 mg daily. Her home blood pressure readings are typically in the 120s/70s to low 80s, although she does not check it regularly. Previously, she was on amlodipine  but discontinued it in May after weight loss.  She has a history of hypothyroidism and is on Synthroid  88mcg daily  She has a history of plantar fasciitis in her right foot, which has resolved and is no longer causing her issues.  She has a mood disorder, primarily depression, which has been more prominent in recent years, especially following her mother's extended illness and passing last year. She is currently taking sertraline  75 mg daily, having adjusted from 100 mg after her mother's passing. She started on sertraline  50 mg about 15 years ago during a divorce and has adjusted the dose as needed over time.  She has a history of elevated cholesterol, for which she takes pravastatin  40mg . Her cholesterol was mildly elevated, prompting the start of statin therapy.  She is currently on Zepbound  5 mg weekly for weight management, having started it five months ago. She is paying for the Zepbound  herself.  She began with 2.5 mg for two months before increasing to 5 mg. She experiences occasional reflux as a side effect but no vomiting or abdominal pain.  She denies a family history of MEN2 or thyroid  cancer.    She had a colonoscopy that revealed a tubular adenoma. She also had an abnormal Pap test with  positive HPV, but the repeat test was negative. She is followed by GYN.    She has a vitamin D  deficiency, for which she takes 2000 units daily. There is a mention of a B12 deficiency in her chart, but she is not currently taking B12 supplements.  Colonoscopy 4/23 with polyp repeat in 7 years.  Her family history includes her mother, who passed away from uterine cancer at age 64 after a prolonged illness. She has a brother who lives two hours from where her mother was, and she maintains regular contact with her stepfather.  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   ROS    Objective:     BP 134/85   Pulse 67   Temp 97.7 F (36.5 C) (Oral)   Resp 20   Ht 5' 2 (1.575 m)   Wt 165 lb (74.8 kg)   LMP 12/18/2023 (Exact Date)   SpO2 98%   BMI 30.18 kg/m    Physical Exam Vitals and nursing note reviewed.  Constitutional:      Appearance: Normal appearance.  HENT:     Head: Normocephalic and atraumatic.  Eyes:     Conjunctiva/sclera: Conjunctivae normal.  Cardiovascular:     Rate and Rhythm: Normal rate and regular rhythm.  Pulmonary:     Effort: Pulmonary effort is normal.     Breath sounds: Normal breath sounds.  Musculoskeletal:  Right lower leg: No edema.     Left lower leg: No edema.  Skin:    General: Skin is warm and dry.  Neurological:     Mental Status: She is alert and oriented to person, place, and time.  Psychiatric:        Mood and Affect: Mood normal.        Behavior: Behavior normal.        Thought Content: Thought content normal.        Judgment: Judgment normal.          No results found for any visits on 01/23/24.    The 10-year ASCVD risk score (Arnett DK, et al., 2019) is: 1.2%    Assessment & Plan:  Primary hypertension  Obesity (BMI 30.0-34.9) Assessment & Plan: She is on Zepbound  and paying for it out-of-pocket.  She has been on  5 mg weekly for the last several months.  She is interested in increasing the dosage to 7.5 mg.  Ask her to follow-up in a month to check on her side effect profile and weight loss.  Orders: -     Tirzepatide -Weight Management; Inject 7.5 mg into the skin once a week.  Dispense: 2 mL; Refill: 0  ASCUS with positive high risk HPV cervical Assessment & Plan: Reports follow-up Pap smear was normal.  Followed by GYN.   Mixed hyperlipidemia Assessment & Plan: Currently on Pravachol  40 mg daily.  Her 10-year ASCVD risk is 1.2%.   Acquired hypothyroidism Assessment & Plan: Has been on levothyroxine  88 mcg.   Vitamin D  deficiency Assessment & Plan: She takes vitamin D3 2000 IU daily.   Essential hypertension Assessment & Plan: She is controlling her blood pressure with HCTZ 25 mg daily.  She has been off amlodipine  since she lost some weight.  Blood pressure today 134/85.      Return in about 4 weeks (around 02/20/2024).    Cecilia Vancleve K Jae Bruck, MD

## 2024-01-23 NOTE — Assessment & Plan Note (Signed)
 She takes vitamin D3 2000 IU daily.

## 2024-01-23 NOTE — Assessment & Plan Note (Signed)
 She is controlling her blood pressure with HCTZ 25 mg daily.  She has been off amlodipine  since she lost some weight.  Blood pressure today 134/85.

## 2024-01-23 NOTE — Assessment & Plan Note (Signed)
 Reports follow-up Pap smear was normal.  Followed by GYN.

## 2024-02-10 ENCOUNTER — Ambulatory Visit: Admitting: Obstetrics and Gynecology

## 2024-02-13 ENCOUNTER — Encounter: Admitting: Nurse Practitioner

## 2024-02-20 ENCOUNTER — Ambulatory Visit: Admitting: Family Medicine

## 2024-02-20 ENCOUNTER — Encounter: Payer: Self-pay | Admitting: Family Medicine

## 2024-02-20 VITALS — BP 121/87 | HR 64 | Temp 97.9°F | Resp 18 | Ht 62.0 in | Wt 159.0 lb

## 2024-02-20 DIAGNOSIS — F39 Unspecified mood [affective] disorder: Secondary | ICD-10-CM

## 2024-02-20 DIAGNOSIS — E66811 Obesity, class 1: Secondary | ICD-10-CM | POA: Diagnosis not present

## 2024-02-20 MED ORDER — TIRZEPATIDE 10 MG/0.5ML ~~LOC~~ SOAJ: 10.0000 mg | SUBCUTANEOUS | 5 refills | Status: DC

## 2024-02-20 MED ORDER — TIRZEPATIDE-WEIGHT MANAGEMENT 7.5 MG/0.5ML ~~LOC~~ SOLN: 7.5000 mg | SUBCUTANEOUS | 5 refills | Status: AC

## 2024-02-23 NOTE — Assessment & Plan Note (Signed)
 Losing weight on Zepbound  7.5 mg and symptoms are minimal.  Continue on 7.5 mg for another month.  No need to increase dosage with such a good response.  Please watch your hydration status.

## 2024-02-23 NOTE — Progress Notes (Signed)
 Established Patient Office Visit  Subjective   Patient ID: Kaitlin Strickland, female    DOB: 10-29-1975  Age: 48 y.o. MRN: [id number]  No chief complaint on file.   HPI Discussed the use of AI scribe software for clinical note transcription with the patient, who gave verbal consent to proceed.  History of Present Illness   Kaitlin Strickland is a 48 year old female who presents for weight management and medication follow-up.  She is currently taking Zepbound  7.5 mg for weight management, resulting in a weight loss of approximately five pounds over the last month, with an average loss of one to two pounds per week. The medication effectively reduces her appetite, and she occasionally needs to remind herself to eat lunch. She experiences occasional constipation, which she attributes to the higher dose of Zepbound , and plans to try a stool softener like Colace. No nausea is reported, but she mentions occasional reflux, which is manageable with Pepcid.  She is also on sertraline  75 mg for mood management and reports that it is working well. She experiences mood fluctuations, particularly on certain days, which she attributes to perimenopausal symptoms. She has maintained a consistent running routine over the past two weeks, aiming to run a race in every state by her [age] birthday. She ran three miles today and is mindful of staying hydrated, especially since starting Zepbound , as she noticed nausea during runs when dehydrated.  She is on Pravachol  for cholesterol management, and her ten-year ASCVD risk is 1.2%. She has not had her vitamin D  level checked in the past year, but recalls a previous level of 31.7 ng/mL. She takes 50 micrograms of vitamin D  daily and has increased her yogurt intake for breakfast, which she believes helps with her vitamin D  levels. She is not a milk drinker.      Kaitlin Strickland is a 48 year old female who presents for weight management and medication follow-up.  She is currently  taking Zepbound  7.5 mg for weight management, resulting in a weight loss of approximately five pounds over the last month, with an average loss of one to two pounds per week. The medication effectively reduces her appetite, and she occasionally needs to remind herself to eat lunch. She experiences occasional constipation, which she attributes to the higher dose of Zepbound , and plans to try a stool softener like Colace. No nausea is reported, but she mentions occasional reflux, which is manageable with Pepcid.  She is also on sertraline  75 mg for mood management and reports that it is working well. She experiences mood fluctuations, particularly on certain days, which she attributes to perimenopausal symptoms. She has maintained a consistent running routine over the past two weeks, aiming to run a race in every state by her [age] birthday. She ran three miles today and is mindful of staying hydrated, especially since starting Zepbound , as she noticed nausea during runs when dehydrated.  She is on Pravachol  for cholesterol management, and her ten-year ASCVD risk is 1.2%. She has not had her vitamin D  level checked in the past year, but recalls a previous level of 31.7 ng/mL. She takes 50 micrograms of vitamin D  daily and has increased her yogurt intake for breakfast, which she believes helps with her vitamin D  levels. She is not a milk drinker.      Objective:     BP 121/87 (BP Location: Left Arm, Patient Position: Sitting, Cuff Size: Normal)   Pulse 64   Temp 97.9 F (36.6 C) (Oral)  Resp 18   Ht 5' 2 (1.575 m)   Wt 159 lb (72.1 kg)   LMP 02/03/2024 (Exact Date)   SpO2 94%   BMI 29.08 kg/m    Physical Exam Vitals and nursing note reviewed.  Constitutional:      Appearance: Normal appearance.  HENT:     Head: Normocephalic and atraumatic.  Eyes:     Conjunctiva/sclera: Conjunctivae normal.  Cardiovascular:     Rate and Rhythm: Normal rate and regular rhythm.  Pulmonary:      Effort: Pulmonary effort is normal.     Breath sounds: Normal breath sounds.  Musculoskeletal:     Right lower leg: No edema.     Left lower leg: No edema.  Skin:    General: Skin is warm and dry.  Neurological:     Mental Status: She is alert and oriented to person, place, and time.  Psychiatric:        Mood and Affect: Mood normal.        Behavior: Behavior normal.        Thought Content: Thought content normal.        Judgment: Judgment normal.          No results found for any visits on 02/20/24.    The 10-year ASCVD risk score (Arnett DK, et al., 2019) is: 1%    Assessment & Plan:  Mood disorder (HCC) Assessment & Plan: On Zoloft  75 mg daily and doing very well.  Discussed exercise as a method to help with depression.   Obesity (BMI 30.0-34.9) Assessment & Plan: Losing weight on Zepbound  7.5 mg and symptoms are minimal.  Continue on 7.5 mg for another month.  No need to increase dosage with such a good response.  Please watch your hydration status.  Orders: -     Tirzepatide -Weight Management; Inject 7.5 mg into the skin once a week.  Dispense: 2 mL; Refill: 5   Assessment and Plan    Obesity, class 1 Obesity, class 1, with recent weight loss of approximately 5 pounds over the last month. Currently on Zepbound  7.5 mg, which is well-tolerated without significant nausea. Reports decreased appetite and food noise, contributing to weight loss. The current dose is effective, and there is no need to increase it at this time. Constipation noted, possibly related to increased Zepbound  dosage. - Continue Zepbound  7.5 mg - Monitor weight and dietary intake - Encourage hydration, especially during exercise - Initiate stool softener (Colace) as needed for constipation  Perimenopausal symptoms Perimenopausal symptoms include mood fluctuations, particularly irritability. She is aware of these changes and is managing them with lifestyle modifications, including exercise. -  Increase exercise to help manage mood fluctuations  Major depressive disorder, in remission on sertraline  Major depressive disorder is in remission with sertraline  75 mg. Reports well-managed mood with occasional perimenopausal symptoms affecting mood, such as irritability. - Continue sertraline  75 mg - Encourage regular exercise to improve mood  Gastroesophageal reflux disease without esophagitis Mild gastroesophageal reflux disease managed with occasional use of Pepcid. Symptoms include mild reflux and burping, particularly noticeable during phone conversations. - Continue as needed use of Pepcid for reflux symptoms  Vitamin D  deficiency Vitamin D  deficiency is being treated with current supplementation. Recent vitamin D  level was 31.7 ng/mL, which is within the acceptable range. - Continue current vitamin D  supplementation (2000 IU daily)        Return in about 4 weeks (around 03/19/2024).    Saburo Luger K Erianna Jolly, MD

## 2024-02-23 NOTE — Assessment & Plan Note (Signed)
 On Zoloft  75 mg daily and doing very well.  Discussed exercise as a method to help with depression.

## 2024-02-24 ENCOUNTER — Other Ambulatory Visit (HOSPITAL_COMMUNITY)
Admission: RE | Admit: 2024-02-24 | Discharge: 2024-02-24 | Disposition: A | Discharge status: Home / Self Care | Source: Ambulatory Visit | Attending: Obstetrics and Gynecology | Admitting: Obstetrics and Gynecology

## 2024-02-24 ENCOUNTER — Encounter: Payer: Self-pay | Admitting: Obstetrics and Gynecology

## 2024-02-24 ENCOUNTER — Ambulatory Visit (INDEPENDENT_AMBULATORY_CARE_PROVIDER_SITE_OTHER): Admitting: Obstetrics and Gynecology

## 2024-02-24 VITALS — BP 121/81 | HR 61 | Ht 62.0 in | Wt 160.0 lb

## 2024-02-24 DIAGNOSIS — N939 Abnormal uterine and vaginal bleeding, unspecified: Secondary | ICD-10-CM

## 2024-02-24 DIAGNOSIS — N930 Postcoital and contact bleeding: Secondary | ICD-10-CM

## 2024-02-24 DIAGNOSIS — Z01419 Encounter for gynecological examination (general) (routine) without abnormal findings: Secondary | ICD-10-CM | POA: Diagnosis not present

## 2024-02-24 DIAGNOSIS — Z1231 Encounter for screening mammogram for malignant neoplasm of breast: Secondary | ICD-10-CM

## 2024-02-24 DIAGNOSIS — Z124 Encounter for screening for malignant neoplasm of cervix: Secondary | ICD-10-CM | POA: Insufficient documentation

## 2024-02-24 DIAGNOSIS — Z30011 Encounter for initial prescription of contraceptive pills: Secondary | ICD-10-CM

## 2024-02-24 MED ORDER — NORETHINDRONE 0.35 MG PO TABS: 1.0000 | ORAL_TABLET | Freq: Every day | ORAL | 3 refills | Status: AC

## 2024-02-24 MED ORDER — DOXYCYCLINE HYCLATE 100 MG PO CAPS: 100.0000 mg | ORAL_CAPSULE | Freq: Two times a day (BID) | ORAL | 0 refills | Status: AC

## 2024-02-24 NOTE — Patient Instructions (Signed)
 I value your feedback and you entrusting [place] with your care. If you get a [facility] patient survey, I would appreciate you taking the time to let us know about your experience today. Thank you! ? ? ?

## 2024-02-24 NOTE — Progress Notes (Signed)
 PCP:  Ziglar, Susan K, MD   Chief Complaint  Patient presents with   Gynecologic Exam    Spotting in between periods x 1 year    HPI:      Ms. Kaitlin Strickland is a 48 y.o. No obstetric history on file. who LMP was Patient's last menstrual period was 02/03/2024 (exact date)., presents today for her annual examination.  Her menses are Q22-30 days since last yr (was a few wks late 7/25), lasting 5-7 days, mod flow, spotting for about 2 days mid cycle, no dysmen. Has been under increased stress, wt loss with GLP1. Had neg GYN u/s 4/24 for AUB. Discussed prog only options to treat. Pt would like to start POPs now; hx of HTN.   Sex activity: currently sexually active--contraception vasectomy. No pain/dryness; has had bleeding with sex intermittently recently. Not every time but can be a significant amount.  Last Pap: 09/02/22 Results were NILM/ neg HPV DNA 07/23/21 Results were no abnormalities/neg HPV DNA 11/21/20  Results were: ASCUS/POS HPV DNA Hx of STDs: HPV  Last Mammogram: 06/27/23 with PCP. Results: normal. Repeat in 1 yr. There is a FH of breast cancer in her PGM, mat grt aunt, genetic testing not indicated. There is no FH of ovarian cancer. Her mom was diagnosed with uterine cancer in past and was gene neg. The patient does do self-breast exams.   Tobacco use: The patient denies current or previous tobacco use. Alcohol use: rare No drug use.  Exercise: moderately active  Colonoscopy: 4/23 with polyp, repeat due after 7 yrs  She does get adequate calcium and Vitamin D  in her diet.  Meds/labs with PCP   Past Medical History:  Diagnosis Date   Annual physical exam 07/31/2018   Anxiety 2012   Bacterial vaginosis 05/26/2019   BV (bacterial vaginosis)    Depression    History of chicken pox    Hypertension    Hypothyroid    h/o abnormal elevated tsh    MVP (mitral valve prolapse) 2000   resolved 2005   Ovarian cyst    Tubular adenoma of colon    09/06/21 [facility] f/u in 7 years  Dr. Maryruth   Vitamin D  deficiency     Past Surgical History:  Procedure Laterality Date   CYSTOSCOPY     childhood   REDUCTION MAMMAPLASTY Bilateral 08/2015   wisdom teeth removal      Family History  Problem Relation Age of Onset   Cancer Mother        uterine   Hypertension Mother    Arthritis Mother    Depression Mother    Hyperlipidemia Mother    Miscarriages / [place] Mother    Thyroid  disease Mother    Uterine cancer Mother        stage 3 dx'ed age 21 s/p hysterectomy/chemo; neg genetic testing   Arthritis Father    Hypertension Father    Diabetes type II Maternal Grandmother    Arthritis Maternal Grandmother    Depression Maternal Grandmother    Diabetes Maternal Grandmother    Hyperlipidemia Maternal Grandmother    Hypertension Maternal Grandmother    Intellectual disability Maternal Grandmother    Hyperlipidemia Maternal Grandfather    Cancer Maternal Grandfather        SCC mohs    Breast cancer Paternal Grandmother 66   Cancer Paternal Grandmother        late 69s    Diabetes Paternal Grandfather    Heart disease Paternal Grandfather  Hypertension Paternal Grandfather     Social History   Socioeconomic History   Marital status: Married    Spouse name: Not on file   Number of children: Not on file   Years of education: Not on file   Highest education level: Professional school degree (e.g., MD, DDS, DVM, JD)  Occupational History   Not on file  Tobacco Use   Smoking status: Never    Passive exposure: Never   Smokeless tobacco: Never  Vaping Use   Vaping status: Never Used  Substance and Sexual Activity   Alcohol use: No   Drug use: No   Sexual activity: Yes    Birth control/protection: Surgical    Comment: Vasectomy  Other Topics Concern   Not on file  Social History Narrative   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    1 daughter    Divorced    No guns, wears seat belt    No etoh, smoking    From [place]     Has boyfriend    Social Drivers of Corporate investment banker Strain: Low Risk  (01/19/2024)   Overall Financial Resource Strain (CARDIA)    Difficulty of Paying Living Expenses: Not hard at all  Food Insecurity: No Food Insecurity (01/19/2024)   Hunger Vital Sign    Worried About Running Out of Food in the Last Year: Never true    Ran Out of Food in the Last Year: Never true  Transportation Needs: No Transportation Needs (01/19/2024)   PRAPARE - Administrator, Civil Service (Medical): No    Lack of Transportation (Non-Medical): No  Physical Activity: Insufficiently Active (01/19/2024)   Exercise Vital Sign    Days of Exercise per Week: 2 days    Minutes of Exercise per Session: 30 min  Stress: No Stress Concern Present (01/19/2024)   [employer] of Occupational Health - Occupational Stress Questionnaire    Feeling of Stress: Only a little  Social Connections: Moderately Isolated (01/19/2024)   Social Connection and Isolation Panel    Frequency of Communication with Friends and Family: Three times a week    Frequency of Social Gatherings with Friends and Family: Once a week    Attends Religious Services: Never    Database administrator or Organizations: No    Attends Engineer, structural: Not on file    Marital Status: Married  Catering manager Violence: Not on file    Current Meds  Medication Sig   cholecalciferol (VITAMIN D ) 1000 UNITS tablet Take 2,000 Units by mouth daily.   doxycycline  (VIBRAMYCIN ) 100 MG capsule Take 1 capsule (100 mg total) by mouth 2 (two) times daily.   hydrochlorothiazide  (HYDRODIURIL ) 25 MG tablet TAKE 0.5 TABLETS (12.5 MG TOTAL) BY MOUTH DAILY. IN THE MORNING   lactobacillus acidophilus (BACID) TABS tablet Take 2 tablets by mouth 3 (three) times daily.   levothyroxine  (SYNTHROID ) 88 MCG tablet Take 1 tablet (88 mcg total) by mouth daily before breakfast. 30 minutes before breakfast   norethindrone  (MICRONOR ) 0.35 MG tablet Take 1 tablet  (0.35 mg total) by mouth daily.   pravastatin  (PRAVACHOL ) 40 MG tablet Take 1 tablet (40 mg total) by mouth daily. After 6 pm   sertraline  (ZOLOFT ) 50 MG tablet Take 1.5 tablets (75 mg total) by mouth daily.   tirzepatide  7.5 MG/0.5ML injection vial Inject 7.5 mg into the skin once a week.     ROS:  Review of Systems  Constitutional:  Negative for fatigue,  fever and unexpected weight change.  Respiratory:  Negative for cough, shortness of breath and wheezing.   Cardiovascular:  Negative for chest pain, palpitations and leg swelling.  Gastrointestinal:  Negative for blood in stool, constipation, diarrhea, nausea and vomiting.  Endocrine: Negative for cold intolerance, heat intolerance and polyuria.  Genitourinary:  Positive for vaginal bleeding. Negative for dyspareunia, dysuria, flank pain, frequency, genital sores, hematuria, menstrual problem, pelvic pain, urgency, vaginal discharge and vaginal pain.  Musculoskeletal:  Negative for back pain, joint swelling and myalgias.  Skin:  Negative for rash.  Neurological:  Negative for dizziness, syncope, light-headedness, numbness and headaches.  Hematological:  Negative for adenopathy.  Psychiatric/Behavioral:  Negative for agitation, confusion, sleep disturbance and suicidal ideas. The patient is not nervous/anxious.      Objective: BP 121/81   Pulse 61   Ht 5' 2 (1.575 m)   Wt 160 lb (72.6 kg)   LMP 02/03/2024 (Exact Date)   BMI 29.26 kg/m    Physical Exam Constitutional:      Appearance: She is well-developed.  Genitourinary:     Vulva normal.     Right Labia: No rash, tenderness or lesions.    Left Labia: No tenderness, lesions or rash.    No vaginal discharge, erythema or tenderness.      Right Adnexa: not tender and no mass present.    Left Adnexa: not tender and no mass present.    Cervical friability present.     No cervical polyp.     Uterus is not enlarged or tender.  Breasts:    Right: No mass, nipple discharge,  skin change or tenderness.     Left: No mass, nipple discharge, skin change or tenderness.  Neck:     Thyroid : No thyromegaly.  Cardiovascular:     Rate and Rhythm: Normal rate and regular rhythm.     Heart sounds: Normal heart sounds. No murmur heard. Pulmonary:     Effort: Pulmonary effort is normal.     Breath sounds: Normal breath sounds.  Abdominal:     Palpations: Abdomen is soft.     Tenderness: There is no abdominal tenderness. There is no guarding or rebound.  Musculoskeletal:        General: Normal range of motion.     Cervical back: Normal range of motion.  Lymphadenopathy:     Cervical: No cervical adenopathy.  Neurological:     General: No focal deficit present.     Mental Status: She is alert and oriented to person, place, and time.     Cranial Nerves: No cranial nerve deficit.  Skin:    General: Skin is warm and dry.  Psychiatric:        Mood and Affect: Mood normal.        Behavior: Behavior normal.        Thought Content: Thought content normal.        Judgment: Judgment normal.  Vitals reviewed.     Assessment/Plan: Encounter for annual routine gynecological examination  Cervical cancer screening - Plan: Cytology - PAP  PCB (post coital bleeding) - Plan: Cytology - PAP, doxycycline  (VIBRAMYCIN ) 100 MG capsule; check pap, friable cx. Rx doxy. F/u prn.   Abnormal uterine bleeding (AUB) - Plan: Cytology - PAP, norethindrone  (MICRONOR ) 0.35 MG tablet; check pap, neg GYN u/s 4/24. Try POPs, Rx eRxd. F/u prn.   Encounter for initial prescription of contraceptive pills - Plan: norethindrone  (MICRONOR ) 0.35 MG tablet  Encounter for screening mammogram for malignant neoplasm  of breast--pt schedules with PCP  Meds ordered this encounter  Medications   doxycycline  (VIBRAMYCIN ) 100 MG capsule    Sig: Take 1 capsule (100 mg total) by mouth 2 (two) times daily.    Dispense:  14 capsule    Refill:  0    Supervising Provider:   ROBY, MICIA [[id number]]    norethindrone  (MICRONOR ) 0.35 MG tablet    Sig: Take 1 tablet (0.35 mg total) by mouth daily.    Dispense:  84 tablet    Refill:  3    Supervising Provider:   ROBY, MICIA [[id number]]     GYN counsel adequate intake of calcium and vitamin D , diet and exercise     F/U  Return in about 1 year (around 02/23/2025).  Danyelle Brookover B. Laqueisha Catalina, PA-C 02/24/2024 4:02 PM

## 2024-03-02 LAB — CYTOLOGY - PAP
Diagnosis: NEGATIVE
Diagnosis: REACTIVE

## 2024-03-03 ENCOUNTER — Encounter: Payer: Self-pay | Admitting: Family Medicine

## 2024-03-15 ENCOUNTER — Ambulatory Visit

## 2024-03-15 DIAGNOSIS — L814 Other melanin hyperpigmentation: Secondary | ICD-10-CM | POA: Diagnosis not present

## 2024-03-15 DIAGNOSIS — L821 Other seborrheic keratosis: Secondary | ICD-10-CM | POA: Diagnosis not present

## 2024-03-15 DIAGNOSIS — D1801 Hemangioma of skin and subcutaneous tissue: Secondary | ICD-10-CM | POA: Diagnosis not present

## 2024-03-15 DIAGNOSIS — Z1283 Encounter for screening for malignant neoplasm of skin: Secondary | ICD-10-CM

## 2024-03-15 DIAGNOSIS — W908XXA Exposure to other nonionizing radiation, initial encounter: Secondary | ICD-10-CM

## 2024-03-15 DIAGNOSIS — D229 Melanocytic nevi, unspecified: Secondary | ICD-10-CM

## 2024-03-15 DIAGNOSIS — L578 Other skin changes due to chronic exposure to nonionizing radiation: Secondary | ICD-10-CM

## 2024-03-15 NOTE — Patient Instructions (Signed)

## 2024-03-15 NOTE — Progress Notes (Signed)
    Subjective   Kaitlin Strickland is a 48 y.o. female who presents for the following: Total body skin exam for skin cancer screening and mole check. The patient has spots, moles and lesions to be evaluated, some may be new or changing and the patient may have concern these could be cancer.. Patient is new patient  Today patient reports: Areas of concern on the face and back.   Review of Systems:    No other skin or systemic complaints except as noted in HPI or Assessment and Plan.  The following portions of the chart were reviewed this encounter and updated as appropriate: medications, allergies, medical history  Relevant Medical History:  Family history of skin cancer - Grandfather unsure of type.    Objective  Well appearing patient in no apparent distress; mood and affect are within normal limits. Examination was performed of the: Full Skin Examination: scalp, head, eyes, ears, nose, lips, neck, chest, axillae, abdomen, back, buttocks, bilateral upper extremities, bilateral lower extremities, hands, feet, fingers, toes, fingernails, and toenails.   Examination notable for: SKIN EXAM, Angioma(s): Scattered red vascular papule(s)  , Lentigo/lentigines: Scattered pigmented macules that are tan to brown in color and are somewhat non-uniform in shape and concentrated in the sun-exposed areas, Nevus/nevi: Scattered well-demarcated, regular, pigmented macule(s) and/or papule(s)  , Seborrheic Keratosis(es): Stuck-on appearing keratotic papule(s) on the trunk, none  irritated with redness, crusting, edema, and/or partial avulsion, Actinic Damage/Elastosis: chronic sun damage: dyspigmentation, telangiectasia, and wrinkling  - irritated angioma R shoulder  Examination limited by: Socks and Undergarments and Patient deferred removal       Assessment & Plan   SKIN CANCER SCREENING PERFORMED TODAY.  BENIGN SKIN FINDINGS  - Lentigines  - Seborrheic keratoses  - Hemangiomas   - Nevus/Multiple Benign  Nevi  - Irritated angioma R shoulder, patient elects to observe - Reassurance provided regarding the benign appearance of lesions noted on exam today; no treatment is indicated in the absence of symptoms/changes. - Reinforced importance of photoprotective strategies including liberal and frequent sunscreen use of a broad-spectrum SPF 30 or greater, use of protective clothing, and sun avoidance for prevention of cutaneous malignancy and photoaging.  Counseled patient on the importance of regular self-skin monitoring as well as routine clinical skin examinations as scheduled.  - Discussed if lesion on R shoulder not improving in next 2 weeks would recommend recheck/ bx   ACTINIC DAMAGE - Chronic condition, secondary to cumulative UV/sun exposure - Recommend daily broad spectrum sunscreen SPF 30+ to sun-exposed areas, reapply every 2 hours as needed.  - Staying in the shade or wearing long sleeves, sun glasses (UVA+UVB protection) and wide brim hats (4-inch brim around the entire circumference of the hat) are also recommended for sun protection.  - Call for new or changing lesions.  Level of service outlined above   Procedures, orders, diagnosis for this visit:    There are no diagnoses linked to this encounter.  Return to clinic: Return in about 1 year (around 03/15/2025) for TBSE.  Documentation: I have reviewed the above documentation for accuracy and completeness, and I agree with the above.  Lauraine JAYSON Kanaris, MD

## 2024-03-19 ENCOUNTER — Encounter: Admitting: Nurse Practitioner

## 2024-05-21 ENCOUNTER — Ambulatory Visit: Admitting: Family Medicine

## 2024-05-21 ENCOUNTER — Encounter: Payer: Self-pay | Admitting: Family Medicine

## 2024-05-21 DIAGNOSIS — Z111 Encounter for screening for respiratory tuberculosis: Secondary | ICD-10-CM | POA: Insufficient documentation

## 2024-05-21 DIAGNOSIS — E66811 Obesity, class 1: Secondary | ICD-10-CM

## 2024-05-21 MED ORDER — TIRZEPATIDE-WEIGHT MANAGEMENT 7.5 MG/0.5ML ~~LOC~~ SOLN: 7.5000 mg | SUBCUTANEOUS | 1 refills | Status: AC

## 2024-05-29 NOTE — Progress Notes (Unsigned)
" ° °  Established Patient Office Visit  Subjective   Patient ID: Kaitlin Strickland, female    DOB: [birth year]  Age: 48 y.o. MRN: [id number]  Chief Complaint  Patient presents with   Weight Loss    HPI [name] 48 year old here for a follow-up.  She has lost 10 pounds since she was last here.  She is also total now 35 pounds.  She feels good she is drinking more water and eating food before she goes for a run                       {History (Optional):23778}  ROS    Objective:     BP 130/83   Pulse 86   Resp 16   Ht 5' 4 (1.626 m)   Wt 150 lb (68 kg)   LMP 04/25/2024   SpO2 98%   BMI 25.75 kg/m  {Vitals History (Optional):23777}  Physical Exam Vitals and nursing note reviewed.  Constitutional:      Appearance: Normal appearance.  HENT:     Head: Normocephalic and atraumatic.  Eyes:     Conjunctiva/sclera: Conjunctivae normal.  Cardiovascular:     Rate and Rhythm: Normal rate and regular rhythm.  Pulmonary:     Effort: Pulmonary effort is normal.     Breath sounds: Normal breath sounds.  Musculoskeletal:     Right lower leg: No edema.     Left lower leg: No edema.  Skin:    General: Skin is warm and dry.  Neurological:     Mental Status: She is alert and oriented to person, place, and time.  Psychiatric:        Mood and Affect: Mood normal.        Behavior: Behavior normal.        Thought Content: Thought content normal.        Judgment: Judgment normal.      {Perform Simple Foot Exam  Perform Detailed exam:1} {Insert foot Exam (Optional):30965}   No results found for any visits on 05/21/24.  {Labs (Optional):23779}  The 10-year ASCVD risk score (Arnett DK, et al., 2019) is: 1.1%    Assessment & Plan:  Obesity (BMI 30.0-34.9) -     Tirzepatide -Weight Management; Inject 7.5 mg into the skin once a week.  Dispense: 2 mL; Refill: 1     No follow-ups on file.    Damani Kelemen K Hebah Bogosian, MD  "

## 2024-06-04 ENCOUNTER — Other Ambulatory Visit: Payer: Self-pay | Admitting: Family Medicine

## 2024-06-04 DIAGNOSIS — Z1231 Encounter for screening mammogram for malignant neoplasm of breast: Secondary | ICD-10-CM

## 2024-07-02 ENCOUNTER — Ambulatory Visit
Admission: RE | Admit: 2024-07-02 | Discharge: 2024-07-02 | Disposition: A | Source: Ambulatory Visit | Attending: Family Medicine | Admitting: Family Medicine

## 2024-07-02 DIAGNOSIS — Z1231 Encounter for screening mammogram for malignant neoplasm of breast: Secondary | ICD-10-CM | POA: Insufficient documentation

## 2024-07-19 ENCOUNTER — Ambulatory Visit: Admitting: Family Medicine

## 2025-03-16 ENCOUNTER — Ambulatory Visit
